# Patient Record
Sex: Male | Born: 1955 | Race: White | Hispanic: No | Marital: Married | Smoking: Former smoker
Health system: Southern US, Community
[De-identification: ages and names within clinical notes are randomized; demographics above are authoritative.]

## PROBLEM LIST (undated history)

## (undated) DIAGNOSIS — E119 Type 2 diabetes mellitus without complications: Secondary | ICD-10-CM

## (undated) DIAGNOSIS — E079 Disorder of thyroid, unspecified: Secondary | ICD-10-CM

## (undated) DIAGNOSIS — I10 Essential (primary) hypertension: Secondary | ICD-10-CM

## (undated) DIAGNOSIS — E785 Hyperlipidemia, unspecified: Secondary | ICD-10-CM

## (undated) HISTORY — DX: Hyperlipidemia, unspecified: E78.5

## (undated) HISTORY — PX: TONSILLECTOMY AND ADENOIDECTOMY: SUR1326

## (undated) HISTORY — PX: HYDROCELE EXCISION / REPAIR: SUR1145

## (undated) HISTORY — DX: Essential (primary) hypertension: I10

## (undated) HISTORY — DX: Type 2 diabetes mellitus without complications: E11.9

## (undated) HISTORY — PX: OTHER SURGICAL HISTORY: SHX169

## (undated) HISTORY — DX: Disorder of thyroid, unspecified: E07.9

## (undated) HISTORY — PX: PROSTATE SURGERY: SHX751

---

## 2013-05-05 DIAGNOSIS — R945 Abnormal results of liver function studies: Secondary | ICD-10-CM | POA: Diagnosis not present

## 2013-05-05 DIAGNOSIS — K7689 Other specified diseases of liver: Secondary | ICD-10-CM | POA: Diagnosis not present

## 2013-05-05 DIAGNOSIS — K5730 Diverticulosis of large intestine without perforation or abscess without bleeding: Secondary | ICD-10-CM | POA: Diagnosis not present

## 2013-05-05 DIAGNOSIS — D3500 Benign neoplasm of unspecified adrenal gland: Secondary | ICD-10-CM | POA: Diagnosis not present

## 2013-05-20 DIAGNOSIS — F331 Major depressive disorder, recurrent, moderate: Secondary | ICD-10-CM | POA: Diagnosis not present

## 2013-05-20 DIAGNOSIS — K5730 Diverticulosis of large intestine without perforation or abscess without bleeding: Secondary | ICD-10-CM | POA: Diagnosis not present

## 2013-05-20 DIAGNOSIS — M1990 Unspecified osteoarthritis, unspecified site: Secondary | ICD-10-CM | POA: Diagnosis not present

## 2013-05-20 DIAGNOSIS — K7689 Other specified diseases of liver: Secondary | ICD-10-CM | POA: Diagnosis not present

## 2013-05-20 DIAGNOSIS — I10 Essential (primary) hypertension: Secondary | ICD-10-CM | POA: Diagnosis not present

## 2013-06-12 DIAGNOSIS — K5730 Diverticulosis of large intestine without perforation or abscess without bleeding: Secondary | ICD-10-CM | POA: Diagnosis not present

## 2013-06-12 DIAGNOSIS — K219 Gastro-esophageal reflux disease without esophagitis: Secondary | ICD-10-CM | POA: Diagnosis not present

## 2013-06-12 DIAGNOSIS — K5900 Constipation, unspecified: Secondary | ICD-10-CM | POA: Diagnosis not present

## 2013-06-12 DIAGNOSIS — R1032 Left lower quadrant pain: Secondary | ICD-10-CM | POA: Diagnosis not present

## 2013-06-24 DIAGNOSIS — R109 Unspecified abdominal pain: Secondary | ICD-10-CM | POA: Diagnosis not present

## 2013-06-24 DIAGNOSIS — K7689 Other specified diseases of liver: Secondary | ICD-10-CM | POA: Diagnosis not present

## 2013-06-24 DIAGNOSIS — K5730 Diverticulosis of large intestine without perforation or abscess without bleeding: Secondary | ICD-10-CM | POA: Diagnosis not present

## 2013-06-24 DIAGNOSIS — K209 Esophagitis, unspecified without bleeding: Secondary | ICD-10-CM | POA: Diagnosis not present

## 2013-06-24 DIAGNOSIS — I10 Essential (primary) hypertension: Secondary | ICD-10-CM | POA: Diagnosis not present

## 2013-06-24 DIAGNOSIS — F17200 Nicotine dependence, unspecified, uncomplicated: Secondary | ICD-10-CM | POA: Diagnosis not present

## 2013-06-24 DIAGNOSIS — N429 Disorder of prostate, unspecified: Secondary | ICD-10-CM | POA: Diagnosis not present

## 2013-06-24 DIAGNOSIS — Z8673 Personal history of transient ischemic attack (TIA), and cerebral infarction without residual deficits: Secondary | ICD-10-CM | POA: Diagnosis not present

## 2013-06-24 DIAGNOSIS — J438 Other emphysema: Secondary | ICD-10-CM | POA: Diagnosis not present

## 2013-06-24 DIAGNOSIS — K5900 Constipation, unspecified: Secondary | ICD-10-CM | POA: Diagnosis not present

## 2013-06-24 DIAGNOSIS — K2990 Gastroduodenitis, unspecified, without bleeding: Secondary | ICD-10-CM | POA: Diagnosis not present

## 2013-06-24 DIAGNOSIS — K208 Other esophagitis without bleeding: Secondary | ICD-10-CM | POA: Diagnosis not present

## 2013-06-24 DIAGNOSIS — K219 Gastro-esophageal reflux disease without esophagitis: Secondary | ICD-10-CM | POA: Diagnosis not present

## 2013-06-24 DIAGNOSIS — Z86010 Personal history of colonic polyps: Secondary | ICD-10-CM | POA: Diagnosis not present

## 2013-06-24 DIAGNOSIS — Z79899 Other long term (current) drug therapy: Secondary | ICD-10-CM | POA: Diagnosis not present

## 2013-06-24 DIAGNOSIS — E7800 Pure hypercholesterolemia, unspecified: Secondary | ICD-10-CM | POA: Diagnosis not present

## 2013-06-24 DIAGNOSIS — K644 Residual hemorrhoidal skin tags: Secondary | ICD-10-CM | POA: Diagnosis not present

## 2013-06-24 DIAGNOSIS — K2970 Gastritis, unspecified, without bleeding: Secondary | ICD-10-CM | POA: Diagnosis not present

## 2013-06-24 DIAGNOSIS — M129 Arthropathy, unspecified: Secondary | ICD-10-CM | POA: Diagnosis not present

## 2013-06-24 DIAGNOSIS — Z1211 Encounter for screening for malignant neoplasm of colon: Secondary | ICD-10-CM | POA: Diagnosis not present

## 2013-06-24 DIAGNOSIS — D126 Benign neoplasm of colon, unspecified: Secondary | ICD-10-CM | POA: Diagnosis not present

## 2013-06-24 DIAGNOSIS — I519 Heart disease, unspecified: Secondary | ICD-10-CM | POA: Diagnosis not present

## 2013-06-24 DIAGNOSIS — E079 Disorder of thyroid, unspecified: Secondary | ICD-10-CM | POA: Diagnosis not present

## 2013-07-22 DIAGNOSIS — J449 Chronic obstructive pulmonary disease, unspecified: Secondary | ICD-10-CM | POA: Diagnosis not present

## 2013-07-22 DIAGNOSIS — E782 Mixed hyperlipidemia: Secondary | ICD-10-CM | POA: Diagnosis not present

## 2013-07-22 DIAGNOSIS — I10 Essential (primary) hypertension: Secondary | ICD-10-CM | POA: Diagnosis not present

## 2013-07-22 DIAGNOSIS — I2510 Atherosclerotic heart disease of native coronary artery without angina pectoris: Secondary | ICD-10-CM | POA: Diagnosis not present

## 2013-08-19 DIAGNOSIS — F331 Major depressive disorder, recurrent, moderate: Secondary | ICD-10-CM | POA: Diagnosis not present

## 2013-09-24 DIAGNOSIS — J449 Chronic obstructive pulmonary disease, unspecified: Secondary | ICD-10-CM | POA: Diagnosis not present

## 2013-09-24 DIAGNOSIS — I672 Cerebral atherosclerosis: Secondary | ICD-10-CM | POA: Diagnosis not present

## 2013-09-24 DIAGNOSIS — R5381 Other malaise: Secondary | ICD-10-CM | POA: Diagnosis not present

## 2013-09-24 DIAGNOSIS — I498 Other specified cardiac arrhythmias: Secondary | ICD-10-CM | POA: Diagnosis not present

## 2013-09-24 DIAGNOSIS — I10 Essential (primary) hypertension: Secondary | ICD-10-CM | POA: Diagnosis not present

## 2013-09-24 DIAGNOSIS — R5383 Other fatigue: Secondary | ICD-10-CM | POA: Diagnosis not present

## 2013-09-24 DIAGNOSIS — E039 Hypothyroidism, unspecified: Secondary | ICD-10-CM | POA: Diagnosis not present

## 2013-09-24 DIAGNOSIS — I2510 Atherosclerotic heart disease of native coronary artery without angina pectoris: Secondary | ICD-10-CM | POA: Diagnosis not present

## 2013-09-30 DIAGNOSIS — I10 Essential (primary) hypertension: Secondary | ICD-10-CM | POA: Diagnosis not present

## 2013-09-30 DIAGNOSIS — E039 Hypothyroidism, unspecified: Secondary | ICD-10-CM | POA: Diagnosis not present

## 2013-09-30 DIAGNOSIS — I498 Other specified cardiac arrhythmias: Secondary | ICD-10-CM | POA: Diagnosis not present

## 2013-09-30 DIAGNOSIS — R5383 Other fatigue: Secondary | ICD-10-CM | POA: Diagnosis not present

## 2013-09-30 DIAGNOSIS — J449 Chronic obstructive pulmonary disease, unspecified: Secondary | ICD-10-CM | POA: Diagnosis not present

## 2013-09-30 DIAGNOSIS — R5381 Other malaise: Secondary | ICD-10-CM | POA: Diagnosis not present

## 2013-09-30 DIAGNOSIS — I672 Cerebral atherosclerosis: Secondary | ICD-10-CM | POA: Diagnosis not present

## 2013-09-30 DIAGNOSIS — I2510 Atherosclerotic heart disease of native coronary artery without angina pectoris: Secondary | ICD-10-CM | POA: Diagnosis not present

## 2013-10-15 DIAGNOSIS — F331 Major depressive disorder, recurrent, moderate: Secondary | ICD-10-CM | POA: Diagnosis not present

## 2013-11-20 ENCOUNTER — Ambulatory Visit: Payer: Self-pay | Admitting: Cardiology

## 2013-11-20 DIAGNOSIS — I2510 Atherosclerotic heart disease of native coronary artery without angina pectoris: Secondary | ICD-10-CM | POA: Diagnosis not present

## 2013-11-20 DIAGNOSIS — I498 Other specified cardiac arrhythmias: Secondary | ICD-10-CM | POA: Diagnosis not present

## 2013-11-20 DIAGNOSIS — I209 Angina pectoris, unspecified: Secondary | ICD-10-CM | POA: Diagnosis not present

## 2013-11-20 DIAGNOSIS — I509 Heart failure, unspecified: Secondary | ICD-10-CM | POA: Diagnosis not present

## 2013-11-20 DIAGNOSIS — R0602 Shortness of breath: Secondary | ICD-10-CM | POA: Diagnosis not present

## 2013-12-02 DIAGNOSIS — R079 Chest pain, unspecified: Secondary | ICD-10-CM | POA: Diagnosis not present

## 2013-12-02 DIAGNOSIS — I739 Peripheral vascular disease, unspecified: Secondary | ICD-10-CM | POA: Diagnosis not present

## 2013-12-02 DIAGNOSIS — R55 Syncope and collapse: Secondary | ICD-10-CM | POA: Diagnosis not present

## 2013-12-02 DIAGNOSIS — Z23 Encounter for immunization: Secondary | ICD-10-CM | POA: Diagnosis not present

## 2013-12-02 DIAGNOSIS — R9431 Abnormal electrocardiogram [ECG] [EKG]: Secondary | ICD-10-CM | POA: Diagnosis not present

## 2013-12-02 DIAGNOSIS — I440 Atrioventricular block, first degree: Secondary | ICD-10-CM | POA: Diagnosis not present

## 2013-12-02 DIAGNOSIS — R001 Bradycardia, unspecified: Secondary | ICD-10-CM | POA: Diagnosis not present

## 2013-12-02 DIAGNOSIS — Z79899 Other long term (current) drug therapy: Secondary | ICD-10-CM | POA: Diagnosis not present

## 2013-12-02 DIAGNOSIS — R0600 Dyspnea, unspecified: Secondary | ICD-10-CM | POA: Diagnosis not present

## 2013-12-02 DIAGNOSIS — R0602 Shortness of breath: Secondary | ICD-10-CM | POA: Diagnosis not present

## 2013-12-02 DIAGNOSIS — I25119 Atherosclerotic heart disease of native coronary artery with unspecified angina pectoris: Secondary | ICD-10-CM | POA: Diagnosis not present

## 2013-12-03 DIAGNOSIS — Z79899 Other long term (current) drug therapy: Secondary | ICD-10-CM | POA: Diagnosis not present

## 2013-12-03 DIAGNOSIS — R0600 Dyspnea, unspecified: Secondary | ICD-10-CM | POA: Diagnosis not present

## 2013-12-03 DIAGNOSIS — R0602 Shortness of breath: Secondary | ICD-10-CM | POA: Diagnosis not present

## 2013-12-03 DIAGNOSIS — I25119 Atherosclerotic heart disease of native coronary artery with unspecified angina pectoris: Secondary | ICD-10-CM | POA: Diagnosis not present

## 2013-12-03 DIAGNOSIS — R55 Syncope and collapse: Secondary | ICD-10-CM | POA: Diagnosis not present

## 2013-12-03 DIAGNOSIS — I739 Peripheral vascular disease, unspecified: Secondary | ICD-10-CM | POA: Diagnosis not present

## 2013-12-24 DIAGNOSIS — F331 Major depressive disorder, recurrent, moderate: Secondary | ICD-10-CM | POA: Diagnosis not present

## 2014-03-23 DIAGNOSIS — F3341 Major depressive disorder, recurrent, in partial remission: Secondary | ICD-10-CM | POA: Diagnosis not present

## 2014-04-20 DIAGNOSIS — J449 Chronic obstructive pulmonary disease, unspecified: Secondary | ICD-10-CM | POA: Diagnosis not present

## 2014-04-20 DIAGNOSIS — M25461 Effusion, right knee: Secondary | ICD-10-CM | POA: Diagnosis not present

## 2014-04-20 DIAGNOSIS — M7989 Other specified soft tissue disorders: Secondary | ICD-10-CM | POA: Diagnosis not present

## 2014-04-20 DIAGNOSIS — S8991XA Unspecified injury of right lower leg, initial encounter: Secondary | ICD-10-CM | POA: Diagnosis not present

## 2014-04-20 DIAGNOSIS — R079 Chest pain, unspecified: Secondary | ICD-10-CM | POA: Diagnosis not present

## 2014-04-20 DIAGNOSIS — E785 Hyperlipidemia, unspecified: Secondary | ICD-10-CM | POA: Diagnosis not present

## 2014-04-20 DIAGNOSIS — M25561 Pain in right knee: Secondary | ICD-10-CM | POA: Diagnosis not present

## 2014-04-20 DIAGNOSIS — S299XXA Unspecified injury of thorax, initial encounter: Secondary | ICD-10-CM | POA: Diagnosis not present

## 2014-04-20 DIAGNOSIS — M549 Dorsalgia, unspecified: Secondary | ICD-10-CM | POA: Diagnosis not present

## 2014-04-20 DIAGNOSIS — I10 Essential (primary) hypertension: Secondary | ICD-10-CM | POA: Diagnosis not present

## 2014-04-20 DIAGNOSIS — E039 Hypothyroidism, unspecified: Secondary | ICD-10-CM | POA: Diagnosis not present

## 2014-04-21 DIAGNOSIS — E785 Hyperlipidemia, unspecified: Secondary | ICD-10-CM | POA: Diagnosis not present

## 2014-04-21 DIAGNOSIS — I10 Essential (primary) hypertension: Secondary | ICD-10-CM | POA: Diagnosis not present

## 2014-05-05 DIAGNOSIS — I10 Essential (primary) hypertension: Secondary | ICD-10-CM | POA: Diagnosis not present

## 2014-05-18 DIAGNOSIS — I10 Essential (primary) hypertension: Secondary | ICD-10-CM | POA: Diagnosis not present

## 2014-06-15 DIAGNOSIS — F3341 Major depressive disorder, recurrent, in partial remission: Secondary | ICD-10-CM | POA: Diagnosis not present

## 2014-10-05 DIAGNOSIS — R0602 Shortness of breath: Secondary | ICD-10-CM | POA: Diagnosis not present

## 2014-10-05 DIAGNOSIS — J449 Chronic obstructive pulmonary disease, unspecified: Secondary | ICD-10-CM | POA: Diagnosis not present

## 2014-10-05 DIAGNOSIS — R042 Hemoptysis: Secondary | ICD-10-CM | POA: Diagnosis not present

## 2014-10-12 DIAGNOSIS — R042 Hemoptysis: Secondary | ICD-10-CM | POA: Diagnosis not present

## 2014-10-12 DIAGNOSIS — I10 Essential (primary) hypertension: Secondary | ICD-10-CM | POA: Diagnosis not present

## 2014-10-15 DIAGNOSIS — J8410 Pulmonary fibrosis, unspecified: Secondary | ICD-10-CM | POA: Diagnosis not present

## 2014-10-15 DIAGNOSIS — I2510 Atherosclerotic heart disease of native coronary artery without angina pectoris: Secondary | ICD-10-CM | POA: Diagnosis not present

## 2014-10-15 DIAGNOSIS — R042 Hemoptysis: Secondary | ICD-10-CM | POA: Diagnosis not present

## 2014-10-15 DIAGNOSIS — J440 Chronic obstructive pulmonary disease with acute lower respiratory infection: Secondary | ICD-10-CM | POA: Diagnosis not present

## 2014-10-20 DIAGNOSIS — R042 Hemoptysis: Secondary | ICD-10-CM | POA: Diagnosis not present

## 2014-10-20 DIAGNOSIS — J440 Chronic obstructive pulmonary disease with acute lower respiratory infection: Secondary | ICD-10-CM | POA: Diagnosis not present

## 2014-11-11 DIAGNOSIS — F321 Major depressive disorder, single episode, moderate: Secondary | ICD-10-CM | POA: Diagnosis not present

## 2014-11-11 DIAGNOSIS — M549 Dorsalgia, unspecified: Secondary | ICD-10-CM | POA: Diagnosis not present

## 2014-11-11 DIAGNOSIS — R0609 Other forms of dyspnea: Secondary | ICD-10-CM | POA: Diagnosis not present

## 2014-11-11 DIAGNOSIS — E785 Hyperlipidemia, unspecified: Secondary | ICD-10-CM | POA: Diagnosis not present

## 2014-11-11 DIAGNOSIS — Z23 Encounter for immunization: Secondary | ICD-10-CM | POA: Diagnosis not present

## 2014-12-11 DIAGNOSIS — M25531 Pain in right wrist: Secondary | ICD-10-CM | POA: Diagnosis not present

## 2014-12-11 DIAGNOSIS — R0602 Shortness of breath: Secondary | ICD-10-CM | POA: Diagnosis not present

## 2014-12-11 DIAGNOSIS — R001 Bradycardia, unspecified: Secondary | ICD-10-CM | POA: Diagnosis not present

## 2014-12-11 DIAGNOSIS — R002 Palpitations: Secondary | ICD-10-CM | POA: Diagnosis not present

## 2014-12-11 DIAGNOSIS — R079 Chest pain, unspecified: Secondary | ICD-10-CM | POA: Diagnosis not present

## 2014-12-11 DIAGNOSIS — M19031 Primary osteoarthritis, right wrist: Secondary | ICD-10-CM | POA: Diagnosis not present

## 2014-12-11 DIAGNOSIS — R55 Syncope and collapse: Secondary | ICD-10-CM | POA: Diagnosis not present

## 2014-12-11 DIAGNOSIS — I509 Heart failure, unspecified: Secondary | ICD-10-CM | POA: Diagnosis not present

## 2014-12-11 DIAGNOSIS — M25539 Pain in unspecified wrist: Secondary | ICD-10-CM | POA: Diagnosis not present

## 2014-12-11 DIAGNOSIS — I209 Angina pectoris, unspecified: Secondary | ICD-10-CM | POA: Diagnosis not present

## 2014-12-15 DIAGNOSIS — R002 Palpitations: Secondary | ICD-10-CM | POA: Diagnosis not present

## 2015-01-14 DIAGNOSIS — R0789 Other chest pain: Secondary | ICD-10-CM | POA: Diagnosis not present

## 2015-01-14 DIAGNOSIS — R0602 Shortness of breath: Secondary | ICD-10-CM | POA: Diagnosis not present

## 2015-01-14 DIAGNOSIS — R001 Bradycardia, unspecified: Secondary | ICD-10-CM | POA: Diagnosis not present

## 2015-01-15 DIAGNOSIS — R001 Bradycardia, unspecified: Secondary | ICD-10-CM | POA: Diagnosis not present

## 2015-01-15 DIAGNOSIS — R0789 Other chest pain: Secondary | ICD-10-CM | POA: Diagnosis not present

## 2015-01-15 DIAGNOSIS — R0602 Shortness of breath: Secondary | ICD-10-CM | POA: Diagnosis not present

## 2015-01-27 DIAGNOSIS — R55 Syncope and collapse: Secondary | ICD-10-CM | POA: Diagnosis not present

## 2015-01-27 DIAGNOSIS — I209 Angina pectoris, unspecified: Secondary | ICD-10-CM | POA: Diagnosis not present

## 2015-01-27 DIAGNOSIS — R001 Bradycardia, unspecified: Secondary | ICD-10-CM | POA: Diagnosis not present

## 2015-05-30 DIAGNOSIS — L02214 Cutaneous abscess of groin: Secondary | ICD-10-CM | POA: Diagnosis not present

## 2015-06-04 DIAGNOSIS — I10 Essential (primary) hypertension: Secondary | ICD-10-CM | POA: Diagnosis not present

## 2015-06-04 DIAGNOSIS — T814XXD Infection following a procedure, subsequent encounter: Secondary | ICD-10-CM | POA: Diagnosis not present

## 2015-06-10 DIAGNOSIS — L0291 Cutaneous abscess, unspecified: Secondary | ICD-10-CM | POA: Diagnosis not present

## 2015-06-10 DIAGNOSIS — I10 Essential (primary) hypertension: Secondary | ICD-10-CM | POA: Diagnosis not present

## 2015-06-10 DIAGNOSIS — K219 Gastro-esophageal reflux disease without esophagitis: Secondary | ICD-10-CM | POA: Diagnosis not present

## 2015-06-10 DIAGNOSIS — L0889 Other specified local infections of the skin and subcutaneous tissue: Secondary | ICD-10-CM | POA: Diagnosis not present

## 2015-06-22 DIAGNOSIS — L0291 Cutaneous abscess, unspecified: Secondary | ICD-10-CM | POA: Diagnosis not present

## 2015-06-22 DIAGNOSIS — L0889 Other specified local infections of the skin and subcutaneous tissue: Secondary | ICD-10-CM | POA: Diagnosis not present

## 2015-08-06 DIAGNOSIS — G629 Polyneuropathy, unspecified: Secondary | ICD-10-CM | POA: Diagnosis not present

## 2015-08-06 DIAGNOSIS — I10 Essential (primary) hypertension: Secondary | ICD-10-CM | POA: Diagnosis not present

## 2015-08-06 DIAGNOSIS — M549 Dorsalgia, unspecified: Secondary | ICD-10-CM | POA: Diagnosis not present

## 2015-08-06 DIAGNOSIS — E785 Hyperlipidemia, unspecified: Secondary | ICD-10-CM | POA: Diagnosis not present

## 2015-08-18 DIAGNOSIS — R079 Chest pain, unspecified: Secondary | ICD-10-CM | POA: Diagnosis not present

## 2015-08-18 DIAGNOSIS — R55 Syncope and collapse: Secondary | ICD-10-CM | POA: Diagnosis not present

## 2015-09-08 DIAGNOSIS — G629 Polyneuropathy, unspecified: Secondary | ICD-10-CM | POA: Diagnosis not present

## 2015-09-08 DIAGNOSIS — I10 Essential (primary) hypertension: Secondary | ICD-10-CM | POA: Diagnosis not present

## 2015-09-08 DIAGNOSIS — E1165 Type 2 diabetes mellitus with hyperglycemia: Secondary | ICD-10-CM | POA: Diagnosis not present

## 2015-09-08 DIAGNOSIS — E785 Hyperlipidemia, unspecified: Secondary | ICD-10-CM | POA: Diagnosis not present

## 2015-10-01 DIAGNOSIS — R079 Chest pain, unspecified: Secondary | ICD-10-CM | POA: Diagnosis not present

## 2015-11-09 DIAGNOSIS — I2510 Atherosclerotic heart disease of native coronary artery without angina pectoris: Secondary | ICD-10-CM | POA: Diagnosis not present

## 2015-11-09 DIAGNOSIS — R072 Precordial pain: Secondary | ICD-10-CM | POA: Diagnosis not present

## 2015-11-24 DIAGNOSIS — E0851 Diabetes mellitus due to underlying condition with diabetic peripheral angiopathy without gangrene: Secondary | ICD-10-CM | POA: Diagnosis not present

## 2015-11-24 DIAGNOSIS — I10 Essential (primary) hypertension: Secondary | ICD-10-CM | POA: Diagnosis not present

## 2015-11-24 DIAGNOSIS — E039 Hypothyroidism, unspecified: Secondary | ICD-10-CM | POA: Diagnosis not present

## 2015-11-24 DIAGNOSIS — K279 Peptic ulcer, site unspecified, unspecified as acute or chronic, without hemorrhage or perforation: Secondary | ICD-10-CM | POA: Diagnosis not present

## 2015-11-24 DIAGNOSIS — E119 Type 2 diabetes mellitus without complications: Secondary | ICD-10-CM | POA: Diagnosis not present

## 2015-11-24 DIAGNOSIS — E785 Hyperlipidemia, unspecified: Secondary | ICD-10-CM | POA: Diagnosis not present

## 2015-12-07 DIAGNOSIS — R1013 Epigastric pain: Secondary | ICD-10-CM | POA: Diagnosis not present

## 2015-12-07 DIAGNOSIS — K279 Peptic ulcer, site unspecified, unspecified as acute or chronic, without hemorrhage or perforation: Secondary | ICD-10-CM | POA: Diagnosis not present

## 2015-12-22 DIAGNOSIS — K279 Peptic ulcer, site unspecified, unspecified as acute or chronic, without hemorrhage or perforation: Secondary | ICD-10-CM | POA: Diagnosis not present

## 2015-12-22 DIAGNOSIS — E785 Hyperlipidemia, unspecified: Secondary | ICD-10-CM | POA: Diagnosis not present

## 2015-12-22 DIAGNOSIS — I10 Essential (primary) hypertension: Secondary | ICD-10-CM | POA: Diagnosis not present

## 2015-12-22 DIAGNOSIS — E0851 Diabetes mellitus due to underlying condition with diabetic peripheral angiopathy without gangrene: Secondary | ICD-10-CM | POA: Diagnosis not present

## 2016-03-12 DIAGNOSIS — J449 Chronic obstructive pulmonary disease, unspecified: Secondary | ICD-10-CM | POA: Diagnosis not present

## 2016-03-12 DIAGNOSIS — R0602 Shortness of breath: Secondary | ICD-10-CM | POA: Diagnosis not present

## 2016-04-21 DIAGNOSIS — E1140 Type 2 diabetes mellitus with diabetic neuropathy, unspecified: Secondary | ICD-10-CM | POA: Diagnosis not present

## 2016-04-21 DIAGNOSIS — E119 Type 2 diabetes mellitus without complications: Secondary | ICD-10-CM | POA: Diagnosis not present

## 2016-04-21 DIAGNOSIS — E785 Hyperlipidemia, unspecified: Secondary | ICD-10-CM | POA: Diagnosis not present

## 2016-04-21 DIAGNOSIS — N281 Cyst of kidney, acquired: Secondary | ICD-10-CM | POA: Diagnosis not present

## 2016-04-21 DIAGNOSIS — I10 Essential (primary) hypertension: Secondary | ICD-10-CM | POA: Diagnosis not present

## 2016-04-27 DIAGNOSIS — K760 Fatty (change of) liver, not elsewhere classified: Secondary | ICD-10-CM | POA: Diagnosis not present

## 2016-04-27 DIAGNOSIS — N2889 Other specified disorders of kidney and ureter: Secondary | ICD-10-CM | POA: Diagnosis not present

## 2016-04-27 DIAGNOSIS — D3500 Benign neoplasm of unspecified adrenal gland: Secondary | ICD-10-CM | POA: Diagnosis not present

## 2016-05-19 DIAGNOSIS — E785 Hyperlipidemia, unspecified: Secondary | ICD-10-CM | POA: Diagnosis not present

## 2016-05-19 DIAGNOSIS — E1140 Type 2 diabetes mellitus with diabetic neuropathy, unspecified: Secondary | ICD-10-CM | POA: Diagnosis not present

## 2016-05-19 DIAGNOSIS — I10 Essential (primary) hypertension: Secondary | ICD-10-CM | POA: Diagnosis not present

## 2016-05-30 DIAGNOSIS — R0602 Shortness of breath: Secondary | ICD-10-CM | POA: Diagnosis not present

## 2016-05-30 DIAGNOSIS — R05 Cough: Secondary | ICD-10-CM | POA: Diagnosis not present

## 2016-05-30 DIAGNOSIS — R0600 Dyspnea, unspecified: Secondary | ICD-10-CM | POA: Diagnosis not present

## 2016-07-09 DIAGNOSIS — J449 Chronic obstructive pulmonary disease, unspecified: Secondary | ICD-10-CM | POA: Diagnosis not present

## 2016-09-12 DIAGNOSIS — E119 Type 2 diabetes mellitus without complications: Secondary | ICD-10-CM | POA: Diagnosis not present

## 2016-09-12 DIAGNOSIS — N200 Calculus of kidney: Secondary | ICD-10-CM | POA: Diagnosis not present

## 2016-09-12 DIAGNOSIS — I10 Essential (primary) hypertension: Secondary | ICD-10-CM | POA: Diagnosis not present

## 2016-11-08 ENCOUNTER — Other Ambulatory Visit: Payer: Self-pay

## 2016-11-08 NOTE — Patient Outreach (Signed)
[place] [facility]) Care Management  11/08/2016  Bryan Mccormick February 04, 1956 [id number]   Medication Adherence call to Bryan Mccormick the reason for this call is because [name] is showing past due on two of his medication Atorvastatin 80 mg and Metformin 500 mg spoke to [facility] they said patient already pick up both of his medication on September 11 patient wont be due until three more month.    [place] Management Direct Dial [phone]  Fax [phone] [email or web address]

## 2017-01-08 DIAGNOSIS — I739 Peripheral vascular disease, unspecified: Secondary | ICD-10-CM | POA: Diagnosis not present

## 2017-01-08 DIAGNOSIS — I10 Essential (primary) hypertension: Secondary | ICD-10-CM | POA: Diagnosis not present

## 2017-01-08 DIAGNOSIS — E1140 Type 2 diabetes mellitus with diabetic neuropathy, unspecified: Secondary | ICD-10-CM | POA: Diagnosis not present

## 2017-01-08 DIAGNOSIS — E785 Hyperlipidemia, unspecified: Secondary | ICD-10-CM | POA: Diagnosis not present

## 2017-01-08 DIAGNOSIS — Z23 Encounter for immunization: Secondary | ICD-10-CM | POA: Diagnosis not present

## 2017-02-07 DIAGNOSIS — Z1389 Encounter for screening for other disorder: Secondary | ICD-10-CM | POA: Diagnosis not present

## 2017-02-07 DIAGNOSIS — Z0000 Encounter for general adult medical examination without abnormal findings: Secondary | ICD-10-CM | POA: Diagnosis not present

## 2017-02-07 DIAGNOSIS — N400 Enlarged prostate without lower urinary tract symptoms: Secondary | ICD-10-CM | POA: Diagnosis not present

## 2017-02-07 DIAGNOSIS — E039 Hypothyroidism, unspecified: Secondary | ICD-10-CM | POA: Diagnosis not present

## 2017-02-07 DIAGNOSIS — E785 Hyperlipidemia, unspecified: Secondary | ICD-10-CM | POA: Diagnosis not present

## 2017-02-07 DIAGNOSIS — E119 Type 2 diabetes mellitus without complications: Secondary | ICD-10-CM | POA: Diagnosis not present

## 2017-02-16 DIAGNOSIS — N3000 Acute cystitis without hematuria: Secondary | ICD-10-CM | POA: Diagnosis not present

## 2017-02-22 DIAGNOSIS — R972 Elevated prostate specific antigen [PSA]: Secondary | ICD-10-CM | POA: Diagnosis not present

## 2017-05-14 DIAGNOSIS — E785 Hyperlipidemia, unspecified: Secondary | ICD-10-CM | POA: Diagnosis not present

## 2017-05-14 DIAGNOSIS — I739 Peripheral vascular disease, unspecified: Secondary | ICD-10-CM | POA: Diagnosis not present

## 2017-05-14 DIAGNOSIS — I10 Essential (primary) hypertension: Secondary | ICD-10-CM | POA: Diagnosis not present

## 2017-05-14 DIAGNOSIS — E1140 Type 2 diabetes mellitus with diabetic neuropathy, unspecified: Secondary | ICD-10-CM | POA: Diagnosis not present

## 2017-05-16 DIAGNOSIS — R002 Palpitations: Secondary | ICD-10-CM | POA: Diagnosis not present

## 2017-07-16 DIAGNOSIS — N3020 Other chronic cystitis without hematuria: Secondary | ICD-10-CM | POA: Diagnosis not present

## 2017-07-16 DIAGNOSIS — E291 Testicular hypofunction: Secondary | ICD-10-CM | POA: Diagnosis not present

## 2017-07-16 DIAGNOSIS — R972 Elevated prostate specific antigen [PSA]: Secondary | ICD-10-CM | POA: Diagnosis not present

## 2017-07-24 DIAGNOSIS — R972 Elevated prostate specific antigen [PSA]: Secondary | ICD-10-CM | POA: Diagnosis not present

## 2017-07-24 DIAGNOSIS — N3020 Other chronic cystitis without hematuria: Secondary | ICD-10-CM | POA: Diagnosis not present

## 2017-08-02 DIAGNOSIS — R972 Elevated prostate specific antigen [PSA]: Secondary | ICD-10-CM | POA: Diagnosis not present

## 2017-08-02 DIAGNOSIS — N4232 Atypical small acinar proliferation of prostate: Secondary | ICD-10-CM | POA: Diagnosis not present

## 2017-08-13 DIAGNOSIS — R972 Elevated prostate specific antigen [PSA]: Secondary | ICD-10-CM | POA: Diagnosis not present

## 2017-08-20 DIAGNOSIS — N3000 Acute cystitis without hematuria: Secondary | ICD-10-CM | POA: Diagnosis not present

## 2017-08-20 DIAGNOSIS — J42 Unspecified chronic bronchitis: Secondary | ICD-10-CM | POA: Diagnosis not present

## 2017-08-23 DIAGNOSIS — J449 Chronic obstructive pulmonary disease, unspecified: Secondary | ICD-10-CM | POA: Diagnosis not present

## 2017-08-27 DIAGNOSIS — N3090 Cystitis, unspecified without hematuria: Secondary | ICD-10-CM | POA: Diagnosis not present

## 2017-08-27 DIAGNOSIS — N3020 Other chronic cystitis without hematuria: Secondary | ICD-10-CM | POA: Diagnosis not present

## 2017-08-31 DIAGNOSIS — N3020 Other chronic cystitis without hematuria: Secondary | ICD-10-CM | POA: Diagnosis not present

## 2017-09-25 DIAGNOSIS — N3020 Other chronic cystitis without hematuria: Secondary | ICD-10-CM | POA: Diagnosis not present

## 2017-09-28 ENCOUNTER — Other Ambulatory Visit: Payer: Self-pay

## 2017-09-28 NOTE — Patient Outreach (Signed)
[place] [facility]) Care Management  09/28/2017  Bryan Mccormick [birth year] [id number]   Medication Adherence call to Bryan Mccormick patients telephone number is disconnected on [facility] and Epic patient is due on Atorvastatin 80 mg. Bryan Mccormick is showing past due under [place].  [place] Management Direct Dial [phone]  Fax [phone] [email or web address]

## 2017-11-22 DIAGNOSIS — I739 Peripheral vascular disease, unspecified: Secondary | ICD-10-CM | POA: Diagnosis not present

## 2017-11-22 DIAGNOSIS — E782 Mixed hyperlipidemia: Secondary | ICD-10-CM | POA: Diagnosis not present

## 2017-11-22 DIAGNOSIS — Z23 Encounter for immunization: Secondary | ICD-10-CM | POA: Diagnosis not present

## 2017-11-22 DIAGNOSIS — I10 Essential (primary) hypertension: Secondary | ICD-10-CM | POA: Diagnosis not present

## 2017-11-22 DIAGNOSIS — E1140 Type 2 diabetes mellitus with diabetic neuropathy, unspecified: Secondary | ICD-10-CM | POA: Diagnosis not present

## 2017-12-25 DIAGNOSIS — E291 Testicular hypofunction: Secondary | ICD-10-CM | POA: Diagnosis not present

## 2017-12-25 DIAGNOSIS — R972 Elevated prostate specific antigen [PSA]: Secondary | ICD-10-CM | POA: Diagnosis not present

## 2018-03-12 DIAGNOSIS — E1169 Type 2 diabetes mellitus with other specified complication: Secondary | ICD-10-CM | POA: Diagnosis not present

## 2018-03-12 DIAGNOSIS — Z0000 Encounter for general adult medical examination without abnormal findings: Secondary | ICD-10-CM | POA: Diagnosis not present

## 2018-03-12 DIAGNOSIS — I10 Essential (primary) hypertension: Secondary | ICD-10-CM | POA: Diagnosis not present

## 2018-03-12 DIAGNOSIS — G8194 Hemiplegia, unspecified affecting left nondominant side: Secondary | ICD-10-CM | POA: Diagnosis not present

## 2018-03-12 DIAGNOSIS — E782 Mixed hyperlipidemia: Secondary | ICD-10-CM | POA: Diagnosis not present

## 2018-03-12 DIAGNOSIS — Z1389 Encounter for screening for other disorder: Secondary | ICD-10-CM | POA: Diagnosis not present

## 2018-05-14 DIAGNOSIS — N3090 Cystitis, unspecified without hematuria: Secondary | ICD-10-CM | POA: Diagnosis not present

## 2018-05-14 DIAGNOSIS — N3020 Other chronic cystitis without hematuria: Secondary | ICD-10-CM | POA: Diagnosis not present

## 2018-05-21 DIAGNOSIS — R10813 Right lower quadrant abdominal tenderness: Secondary | ICD-10-CM | POA: Diagnosis not present

## 2018-06-12 DIAGNOSIS — E1169 Type 2 diabetes mellitus with other specified complication: Secondary | ICD-10-CM | POA: Diagnosis not present

## 2018-06-12 DIAGNOSIS — E782 Mixed hyperlipidemia: Secondary | ICD-10-CM | POA: Diagnosis not present

## 2018-06-12 DIAGNOSIS — I10 Essential (primary) hypertension: Secondary | ICD-10-CM | POA: Diagnosis not present

## 2018-06-12 DIAGNOSIS — G8194 Hemiplegia, unspecified affecting left nondominant side: Secondary | ICD-10-CM | POA: Diagnosis not present

## 2018-07-15 ENCOUNTER — Other Ambulatory Visit: Payer: Self-pay

## 2018-07-15 NOTE — Patient Outreach (Signed)
[place] [facility]) Care Management  07/15/2018  Bryan Mccormick 1956/02/12 [id number]   Medication Adherence call to [place] Dooms patient has a disconnected number. [name] is showing past due under [place].   [place] Management Direct Dial [phone]  Fax [phone] [name]

## 2018-07-29 ENCOUNTER — Other Ambulatory Visit: Payer: Self-pay

## 2018-07-29 NOTE — Patient Outreach (Signed)
[place] [facility]) Care Management  07/29/2018  Bryan Mccormick 10-16-1955 [id number]   Medication Adherence call to [name] patient has a disconnected Number [name] is showing past due under [place].   [place] Management Direct Dial [phone]  Fax [phone] [email or web address]

## 2018-08-09 DIAGNOSIS — W57XXXA Bitten or stung by nonvenomous insect and other nonvenomous arthropods, initial encounter: Secondary | ICD-10-CM | POA: Diagnosis not present

## 2018-08-09 DIAGNOSIS — Z7689 Persons encountering health services in other specified circumstances: Secondary | ICD-10-CM | POA: Diagnosis not present

## 2018-08-09 DIAGNOSIS — L02419 Cutaneous abscess of limb, unspecified: Secondary | ICD-10-CM | POA: Diagnosis not present

## 2018-08-20 DIAGNOSIS — L905 Scar conditions and fibrosis of skin: Secondary | ICD-10-CM | POA: Diagnosis not present

## 2018-09-11 DIAGNOSIS — G8194 Hemiplegia, unspecified affecting left nondominant side: Secondary | ICD-10-CM | POA: Diagnosis not present

## 2018-09-11 DIAGNOSIS — E782 Mixed hyperlipidemia: Secondary | ICD-10-CM | POA: Diagnosis not present

## 2018-09-11 DIAGNOSIS — E1169 Type 2 diabetes mellitus with other specified complication: Secondary | ICD-10-CM | POA: Diagnosis not present

## 2018-09-11 DIAGNOSIS — I10 Essential (primary) hypertension: Secondary | ICD-10-CM | POA: Diagnosis not present

## 2018-09-11 DIAGNOSIS — E039 Hypothyroidism, unspecified: Secondary | ICD-10-CM | POA: Diagnosis not present

## 2018-12-13 DIAGNOSIS — I739 Peripheral vascular disease, unspecified: Secondary | ICD-10-CM | POA: Diagnosis not present

## 2018-12-13 DIAGNOSIS — E1151 Type 2 diabetes mellitus with diabetic peripheral angiopathy without gangrene: Secondary | ICD-10-CM | POA: Diagnosis not present

## 2018-12-13 DIAGNOSIS — Z23 Encounter for immunization: Secondary | ICD-10-CM | POA: Diagnosis not present

## 2018-12-13 DIAGNOSIS — I10 Essential (primary) hypertension: Secondary | ICD-10-CM | POA: Diagnosis not present

## 2019-02-13 DIAGNOSIS — N3020 Other chronic cystitis without hematuria: Secondary | ICD-10-CM | POA: Diagnosis not present

## 2019-03-18 DIAGNOSIS — G8194 Hemiplegia, unspecified affecting left nondominant side: Secondary | ICD-10-CM | POA: Diagnosis not present

## 2019-03-18 DIAGNOSIS — E1169 Type 2 diabetes mellitus with other specified complication: Secondary | ICD-10-CM | POA: Diagnosis not present

## 2019-03-18 DIAGNOSIS — I10 Essential (primary) hypertension: Secondary | ICD-10-CM | POA: Diagnosis not present

## 2019-03-18 DIAGNOSIS — E782 Mixed hyperlipidemia: Secondary | ICD-10-CM | POA: Diagnosis not present

## 2019-06-16 DIAGNOSIS — I10 Essential (primary) hypertension: Secondary | ICD-10-CM | POA: Diagnosis not present

## 2019-06-16 DIAGNOSIS — E039 Hypothyroidism, unspecified: Secondary | ICD-10-CM | POA: Diagnosis not present

## 2019-06-16 DIAGNOSIS — E1169 Type 2 diabetes mellitus with other specified complication: Secondary | ICD-10-CM | POA: Diagnosis not present

## 2019-06-16 DIAGNOSIS — E782 Mixed hyperlipidemia: Secondary | ICD-10-CM | POA: Diagnosis not present

## 2019-09-15 DIAGNOSIS — E782 Mixed hyperlipidemia: Secondary | ICD-10-CM | POA: Diagnosis not present

## 2019-09-15 DIAGNOSIS — G8194 Hemiplegia, unspecified affecting left nondominant side: Secondary | ICD-10-CM | POA: Diagnosis not present

## 2019-09-15 DIAGNOSIS — I10 Essential (primary) hypertension: Secondary | ICD-10-CM | POA: Diagnosis not present

## 2019-09-15 DIAGNOSIS — R3912 Poor urinary stream: Secondary | ICD-10-CM | POA: Diagnosis not present

## 2019-11-11 DIAGNOSIS — E782 Mixed hyperlipidemia: Secondary | ICD-10-CM | POA: Diagnosis not present

## 2019-11-11 DIAGNOSIS — E1169 Type 2 diabetes mellitus with other specified complication: Secondary | ICD-10-CM | POA: Diagnosis not present

## 2019-12-17 DIAGNOSIS — Z1389 Encounter for screening for other disorder: Secondary | ICD-10-CM | POA: Diagnosis not present

## 2019-12-17 DIAGNOSIS — I10 Essential (primary) hypertension: Secondary | ICD-10-CM | POA: Diagnosis not present

## 2019-12-17 DIAGNOSIS — E782 Mixed hyperlipidemia: Secondary | ICD-10-CM | POA: Diagnosis not present

## 2019-12-17 DIAGNOSIS — Z0000 Encounter for general adult medical examination without abnormal findings: Secondary | ICD-10-CM | POA: Diagnosis not present

## 2019-12-17 DIAGNOSIS — I2510 Atherosclerotic heart disease of native coronary artery without angina pectoris: Secondary | ICD-10-CM | POA: Diagnosis not present

## 2019-12-17 DIAGNOSIS — Z23 Encounter for immunization: Secondary | ICD-10-CM | POA: Diagnosis not present

## 2020-01-08 DIAGNOSIS — Z01818 Encounter for other preprocedural examination: Secondary | ICD-10-CM | POA: Diagnosis not present

## 2020-01-27 DIAGNOSIS — Z1159 Encounter for screening for other viral diseases: Secondary | ICD-10-CM | POA: Diagnosis not present

## 2020-02-03 DIAGNOSIS — J45909 Unspecified asthma, uncomplicated: Secondary | ICD-10-CM | POA: Diagnosis not present

## 2020-02-03 DIAGNOSIS — K635 Polyp of colon: Secondary | ICD-10-CM | POA: Diagnosis not present

## 2020-02-03 DIAGNOSIS — Z955 Presence of coronary angioplasty implant and graft: Secondary | ICD-10-CM | POA: Diagnosis not present

## 2020-02-03 DIAGNOSIS — K5730 Diverticulosis of large intestine without perforation or abscess without bleeding: Secondary | ICD-10-CM | POA: Diagnosis not present

## 2020-02-03 DIAGNOSIS — E119 Type 2 diabetes mellitus without complications: Secondary | ICD-10-CM | POA: Diagnosis not present

## 2020-02-03 DIAGNOSIS — I252 Old myocardial infarction: Secondary | ICD-10-CM | POA: Diagnosis not present

## 2020-02-03 DIAGNOSIS — Z7982 Long term (current) use of aspirin: Secondary | ICD-10-CM | POA: Diagnosis not present

## 2020-02-03 DIAGNOSIS — Z720 Tobacco use: Secondary | ICD-10-CM | POA: Diagnosis not present

## 2020-02-03 DIAGNOSIS — I2510 Atherosclerotic heart disease of native coronary artery without angina pectoris: Secondary | ICD-10-CM | POA: Diagnosis not present

## 2020-02-03 DIAGNOSIS — Z86010 Personal history of colonic polyps: Secondary | ICD-10-CM | POA: Diagnosis not present

## 2020-02-03 DIAGNOSIS — K648 Other hemorrhoids: Secondary | ICD-10-CM | POA: Diagnosis not present

## 2020-02-03 DIAGNOSIS — D122 Benign neoplasm of ascending colon: Secondary | ICD-10-CM | POA: Diagnosis not present

## 2020-02-03 DIAGNOSIS — Z79899 Other long term (current) drug therapy: Secondary | ICD-10-CM | POA: Diagnosis not present

## 2020-02-12 ENCOUNTER — Other Ambulatory Visit: Payer: Self-pay | Admitting: Urology

## 2020-02-12 DIAGNOSIS — R972 Elevated prostate specific antigen [PSA]: Secondary | ICD-10-CM

## 2020-03-09 ENCOUNTER — Other Ambulatory Visit: Payer: Self-pay

## 2020-03-09 ENCOUNTER — Ambulatory Visit
Admission: RE | Admit: 2020-03-09 | Discharge: 2020-03-09 | Disposition: A | Discharge status: Home / Self Care | Payer: Medicare Other | Source: Ambulatory Visit | Attending: Urology | Admitting: Urology

## 2020-03-09 DIAGNOSIS — R972 Elevated prostate specific antigen [PSA]: Secondary | ICD-10-CM

## 2020-03-09 DIAGNOSIS — R590 Localized enlarged lymph nodes: Secondary | ICD-10-CM | POA: Diagnosis not present

## 2020-03-09 MED ORDER — GADOBENATE DIMEGLUMINE 529 MG/ML IV SOLN
18.0000 mL | Freq: Once | INTRAVENOUS | Status: AC | PRN
  Administered 2020-03-09: 18 mL via INTRAVENOUS

## 2020-04-12 DIAGNOSIS — I10 Essential (primary) hypertension: Secondary | ICD-10-CM | POA: Diagnosis not present

## 2020-04-12 DIAGNOSIS — E782 Mixed hyperlipidemia: Secondary | ICD-10-CM | POA: Diagnosis not present

## 2020-04-12 DIAGNOSIS — E1169 Type 2 diabetes mellitus with other specified complication: Secondary | ICD-10-CM | POA: Diagnosis not present

## 2020-04-12 DIAGNOSIS — J449 Chronic obstructive pulmonary disease, unspecified: Secondary | ICD-10-CM | POA: Diagnosis not present

## 2020-07-12 DIAGNOSIS — E1169 Type 2 diabetes mellitus with other specified complication: Secondary | ICD-10-CM | POA: Diagnosis not present

## 2020-07-12 DIAGNOSIS — J449 Chronic obstructive pulmonary disease, unspecified: Secondary | ICD-10-CM | POA: Diagnosis not present

## 2020-07-12 DIAGNOSIS — E782 Mixed hyperlipidemia: Secondary | ICD-10-CM | POA: Diagnosis not present

## 2020-07-12 DIAGNOSIS — I10 Essential (primary) hypertension: Secondary | ICD-10-CM | POA: Diagnosis not present

## 2020-08-13 DIAGNOSIS — M5441 Lumbago with sciatica, right side: Secondary | ICD-10-CM | POA: Diagnosis not present

## 2020-08-13 DIAGNOSIS — G8929 Other chronic pain: Secondary | ICD-10-CM | POA: Diagnosis not present

## 2020-08-13 DIAGNOSIS — R3911 Hesitancy of micturition: Secondary | ICD-10-CM | POA: Diagnosis not present

## 2020-11-15 DIAGNOSIS — I10 Essential (primary) hypertension: Secondary | ICD-10-CM | POA: Diagnosis not present

## 2020-11-15 DIAGNOSIS — J449 Chronic obstructive pulmonary disease, unspecified: Secondary | ICD-10-CM | POA: Diagnosis not present

## 2020-11-15 DIAGNOSIS — Z23 Encounter for immunization: Secondary | ICD-10-CM | POA: Diagnosis not present

## 2020-11-15 DIAGNOSIS — E782 Mixed hyperlipidemia: Secondary | ICD-10-CM | POA: Diagnosis not present

## 2020-11-15 DIAGNOSIS — E1169 Type 2 diabetes mellitus with other specified complication: Secondary | ICD-10-CM | POA: Diagnosis not present

## 2021-02-14 DIAGNOSIS — E782 Mixed hyperlipidemia: Secondary | ICD-10-CM | POA: Diagnosis not present

## 2021-02-14 DIAGNOSIS — J449 Chronic obstructive pulmonary disease, unspecified: Secondary | ICD-10-CM | POA: Diagnosis not present

## 2021-02-14 DIAGNOSIS — I10 Essential (primary) hypertension: Secondary | ICD-10-CM | POA: Diagnosis not present

## 2021-02-14 DIAGNOSIS — E1169 Type 2 diabetes mellitus with other specified complication: Secondary | ICD-10-CM | POA: Diagnosis not present

## 2021-03-01 DIAGNOSIS — I2510 Atherosclerotic heart disease of native coronary artery without angina pectoris: Secondary | ICD-10-CM | POA: Diagnosis not present

## 2021-03-01 DIAGNOSIS — I10 Essential (primary) hypertension: Secondary | ICD-10-CM | POA: Diagnosis not present

## 2021-03-01 DIAGNOSIS — D210 Benign neoplasm of connective and other soft tissue of head, face and neck: Secondary | ICD-10-CM | POA: Diagnosis not present

## 2021-03-01 DIAGNOSIS — J449 Chronic obstructive pulmonary disease, unspecified: Secondary | ICD-10-CM | POA: Diagnosis not present

## 2021-05-16 DIAGNOSIS — J449 Chronic obstructive pulmonary disease, unspecified: Secondary | ICD-10-CM | POA: Diagnosis not present

## 2021-05-16 DIAGNOSIS — E782 Mixed hyperlipidemia: Secondary | ICD-10-CM | POA: Diagnosis not present

## 2021-05-16 DIAGNOSIS — E1169 Type 2 diabetes mellitus with other specified complication: Secondary | ICD-10-CM | POA: Diagnosis not present

## 2021-05-16 DIAGNOSIS — I10 Essential (primary) hypertension: Secondary | ICD-10-CM | POA: Diagnosis not present

## 2021-05-19 DIAGNOSIS — I739 Peripheral vascular disease, unspecified: Secondary | ICD-10-CM | POA: Diagnosis not present

## 2021-05-19 DIAGNOSIS — M79605 Pain in left leg: Secondary | ICD-10-CM | POA: Diagnosis not present

## 2021-05-19 DIAGNOSIS — M79604 Pain in right leg: Secondary | ICD-10-CM | POA: Diagnosis not present

## 2021-05-23 DIAGNOSIS — E663 Overweight: Secondary | ICD-10-CM | POA: Diagnosis not present

## 2021-05-23 DIAGNOSIS — I25119 Atherosclerotic heart disease of native coronary artery with unspecified angina pectoris: Secondary | ICD-10-CM | POA: Diagnosis not present

## 2021-05-23 DIAGNOSIS — E785 Hyperlipidemia, unspecified: Secondary | ICD-10-CM | POA: Diagnosis not present

## 2021-05-23 DIAGNOSIS — Z008 Encounter for other general examination: Secondary | ICD-10-CM | POA: Diagnosis not present

## 2021-05-23 DIAGNOSIS — I69344 Monoplegia of lower limb following cerebral infarction affecting left non-dominant side: Secondary | ICD-10-CM | POA: Diagnosis not present

## 2021-05-23 DIAGNOSIS — I252 Old myocardial infarction: Secondary | ICD-10-CM | POA: Diagnosis not present

## 2021-05-23 DIAGNOSIS — E1151 Type 2 diabetes mellitus with diabetic peripheral angiopathy without gangrene: Secondary | ICD-10-CM | POA: Diagnosis not present

## 2021-05-23 DIAGNOSIS — R69 Illness, unspecified: Secondary | ICD-10-CM | POA: Diagnosis not present

## 2021-05-23 DIAGNOSIS — E039 Hypothyroidism, unspecified: Secondary | ICD-10-CM | POA: Diagnosis not present

## 2021-05-23 DIAGNOSIS — N400 Enlarged prostate without lower urinary tract symptoms: Secondary | ICD-10-CM | POA: Diagnosis not present

## 2021-05-23 DIAGNOSIS — I10 Essential (primary) hypertension: Secondary | ICD-10-CM | POA: Diagnosis not present

## 2021-05-23 DIAGNOSIS — K219 Gastro-esophageal reflux disease without esophagitis: Secondary | ICD-10-CM | POA: Diagnosis not present

## 2021-07-15 DIAGNOSIS — N401 Enlarged prostate with lower urinary tract symptoms: Secondary | ICD-10-CM | POA: Diagnosis not present

## 2021-07-15 DIAGNOSIS — R1031 Right lower quadrant pain: Secondary | ICD-10-CM | POA: Diagnosis not present

## 2021-07-15 DIAGNOSIS — N138 Other obstructive and reflux uropathy: Secondary | ICD-10-CM | POA: Diagnosis not present

## 2021-08-17 DIAGNOSIS — E782 Mixed hyperlipidemia: Secondary | ICD-10-CM | POA: Diagnosis not present

## 2021-08-17 DIAGNOSIS — E1169 Type 2 diabetes mellitus with other specified complication: Secondary | ICD-10-CM | POA: Diagnosis not present

## 2021-08-17 DIAGNOSIS — I10 Essential (primary) hypertension: Secondary | ICD-10-CM | POA: Diagnosis not present

## 2021-08-17 DIAGNOSIS — J449 Chronic obstructive pulmonary disease, unspecified: Secondary | ICD-10-CM | POA: Diagnosis not present

## 2021-11-18 DIAGNOSIS — E782 Mixed hyperlipidemia: Secondary | ICD-10-CM | POA: Diagnosis not present

## 2021-11-18 DIAGNOSIS — R69 Illness, unspecified: Secondary | ICD-10-CM | POA: Diagnosis not present

## 2021-11-18 DIAGNOSIS — I10 Essential (primary) hypertension: Secondary | ICD-10-CM | POA: Diagnosis not present

## 2021-11-18 DIAGNOSIS — E1169 Type 2 diabetes mellitus with other specified complication: Secondary | ICD-10-CM | POA: Diagnosis not present

## 2021-12-16 DIAGNOSIS — J00 Acute nasopharyngitis [common cold]: Secondary | ICD-10-CM | POA: Diagnosis not present

## 2022-02-06 DIAGNOSIS — I10 Essential (primary) hypertension: Secondary | ICD-10-CM | POA: Diagnosis not present

## 2022-02-06 DIAGNOSIS — E785 Hyperlipidemia, unspecified: Secondary | ICD-10-CM | POA: Diagnosis not present

## 2022-02-06 DIAGNOSIS — E119 Type 2 diabetes mellitus without complications: Secondary | ICD-10-CM | POA: Diagnosis not present

## 2022-02-06 DIAGNOSIS — R001 Bradycardia, unspecified: Secondary | ICD-10-CM | POA: Diagnosis not present

## 2022-02-06 DIAGNOSIS — I200 Unstable angina: Secondary | ICD-10-CM | POA: Diagnosis not present

## 2022-02-06 DIAGNOSIS — I739 Peripheral vascular disease, unspecified: Secondary | ICD-10-CM | POA: Diagnosis not present

## 2022-02-16 DIAGNOSIS — I77811 Abdominal aortic ectasia: Secondary | ICD-10-CM | POA: Diagnosis not present

## 2022-02-16 DIAGNOSIS — I739 Peripheral vascular disease, unspecified: Secondary | ICD-10-CM | POA: Diagnosis not present

## 2022-02-16 DIAGNOSIS — I70201 Unspecified atherosclerosis of native arteries of extremities, right leg: Secondary | ICD-10-CM | POA: Diagnosis not present

## 2022-02-16 DIAGNOSIS — K5730 Diverticulosis of large intestine without perforation or abscess without bleeding: Secondary | ICD-10-CM | POA: Diagnosis not present

## 2022-02-21 DIAGNOSIS — I440 Atrioventricular block, first degree: Secondary | ICD-10-CM | POA: Diagnosis not present

## 2022-02-21 DIAGNOSIS — I200 Unstable angina: Secondary | ICD-10-CM | POA: Diagnosis not present

## 2022-02-21 DIAGNOSIS — Z7984 Long term (current) use of oral hypoglycemic drugs: Secondary | ICD-10-CM | POA: Diagnosis not present

## 2022-02-21 DIAGNOSIS — I2510 Atherosclerotic heart disease of native coronary artery without angina pectoris: Secondary | ICD-10-CM | POA: Diagnosis not present

## 2022-02-21 DIAGNOSIS — E785 Hyperlipidemia, unspecified: Secondary | ICD-10-CM | POA: Diagnosis not present

## 2022-02-21 DIAGNOSIS — E039 Hypothyroidism, unspecified: Secondary | ICD-10-CM | POA: Diagnosis not present

## 2022-02-21 DIAGNOSIS — Z95828 Presence of other vascular implants and grafts: Secondary | ICD-10-CM | POA: Diagnosis not present

## 2022-02-21 DIAGNOSIS — I10 Essential (primary) hypertension: Secondary | ICD-10-CM | POA: Diagnosis not present

## 2022-02-21 DIAGNOSIS — Z01810 Encounter for preprocedural cardiovascular examination: Secondary | ICD-10-CM | POA: Diagnosis not present

## 2022-02-21 DIAGNOSIS — R079 Chest pain, unspecified: Secondary | ICD-10-CM | POA: Diagnosis not present

## 2022-02-21 DIAGNOSIS — G4733 Obstructive sleep apnea (adult) (pediatric): Secondary | ICD-10-CM | POA: Diagnosis not present

## 2022-02-21 DIAGNOSIS — R001 Bradycardia, unspecified: Secondary | ICD-10-CM | POA: Diagnosis not present

## 2022-02-21 DIAGNOSIS — Z7982 Long term (current) use of aspirin: Secondary | ICD-10-CM | POA: Diagnosis not present

## 2022-02-21 DIAGNOSIS — I739 Peripheral vascular disease, unspecified: Secondary | ICD-10-CM | POA: Diagnosis not present

## 2022-02-21 DIAGNOSIS — J449 Chronic obstructive pulmonary disease, unspecified: Secondary | ICD-10-CM | POA: Diagnosis not present

## 2022-02-21 DIAGNOSIS — E119 Type 2 diabetes mellitus without complications: Secondary | ICD-10-CM | POA: Diagnosis not present

## 2022-02-21 DIAGNOSIS — Z79899 Other long term (current) drug therapy: Secondary | ICD-10-CM | POA: Diagnosis not present

## 2022-02-21 DIAGNOSIS — Z87891 Personal history of nicotine dependence: Secondary | ICD-10-CM | POA: Diagnosis not present

## 2022-03-13 ENCOUNTER — Other Ambulatory Visit: Payer: Self-pay

## 2022-03-13 MED ORDER — FUROSEMIDE 40 MG PO TABS: 40.0000 mg | ORAL_TABLET | Freq: Every day | ORAL | 2 refills | Status: DC

## 2022-03-13 MED ORDER — POTASSIUM CHLORIDE CRYS ER 20 MEQ PO TBCR: 20.0000 meq | EXTENDED_RELEASE_TABLET | Freq: Every day | ORAL | 2 refills | Status: DC

## 2022-03-13 MED ORDER — AMLODIPINE BESYLATE 2.5 MG PO TABS: 2.5000 mg | ORAL_TABLET | Freq: Every day | ORAL | 2 refills | Status: DC

## 2022-03-20 ENCOUNTER — Other Ambulatory Visit: Payer: Self-pay

## 2022-03-20 ENCOUNTER — Other Ambulatory Visit: Payer: Self-pay | Admitting: Internal Medicine

## 2022-03-20 MED ORDER — METFORMIN HCL ER 500 MG PO TB24: 500.0000 mg | ORAL_TABLET | Freq: Two times a day (BID) | ORAL | 2 refills | Status: DC

## 2022-03-20 MED ORDER — TAMSULOSIN HCL 0.4 MG PO CAPS: 0.8000 mg | ORAL_CAPSULE | Freq: Every day | ORAL | 2 refills | Status: DC

## 2022-03-20 MED ORDER — OMEPRAZOLE 40 MG PO CPDR: DELAYED_RELEASE_CAPSULE | ORAL | 2 refills | Status: DC

## 2022-03-20 MED ORDER — CLOTRIMAZOLE-BETAMETHASONE 1-0.05 % EX CREA: 1.0000 | TOPICAL_CREAM | Freq: Two times a day (BID) | CUTANEOUS | 0 refills | Status: DC

## 2022-03-20 MED ORDER — FENOFIBRATE 145 MG PO TABS: 145.0000 mg | ORAL_TABLET | Freq: Every day | ORAL | 2 refills | Status: DC

## 2022-03-20 MED ORDER — DIVALPROEX SODIUM ER 250 MG PO TB24: 250.0000 mg | ORAL_TABLET | Freq: Every day | ORAL | 2 refills | Status: DC

## 2022-03-23 ENCOUNTER — Other Ambulatory Visit: Payer: Self-pay

## 2022-03-23 DIAGNOSIS — R69 Illness, unspecified: Secondary | ICD-10-CM | POA: Diagnosis not present

## 2022-03-23 DIAGNOSIS — J449 Chronic obstructive pulmonary disease, unspecified: Secondary | ICD-10-CM | POA: Diagnosis not present

## 2022-03-23 DIAGNOSIS — Z008 Encounter for other general examination: Secondary | ICD-10-CM | POA: Diagnosis not present

## 2022-03-23 DIAGNOSIS — G4733 Obstructive sleep apnea (adult) (pediatric): Secondary | ICD-10-CM | POA: Diagnosis not present

## 2022-03-23 DIAGNOSIS — N400 Enlarged prostate without lower urinary tract symptoms: Secondary | ICD-10-CM | POA: Diagnosis not present

## 2022-03-23 DIAGNOSIS — I10 Essential (primary) hypertension: Secondary | ICD-10-CM | POA: Diagnosis not present

## 2022-03-23 DIAGNOSIS — E1151 Type 2 diabetes mellitus with diabetic peripheral angiopathy without gangrene: Secondary | ICD-10-CM | POA: Diagnosis not present

## 2022-03-23 DIAGNOSIS — I25119 Atherosclerotic heart disease of native coronary artery with unspecified angina pectoris: Secondary | ICD-10-CM | POA: Diagnosis not present

## 2022-03-23 DIAGNOSIS — E785 Hyperlipidemia, unspecified: Secondary | ICD-10-CM | POA: Diagnosis not present

## 2022-03-23 DIAGNOSIS — N529 Male erectile dysfunction, unspecified: Secondary | ICD-10-CM | POA: Diagnosis not present

## 2022-03-23 DIAGNOSIS — E039 Hypothyroidism, unspecified: Secondary | ICD-10-CM | POA: Diagnosis not present

## 2022-03-23 DIAGNOSIS — F319 Bipolar disorder, unspecified: Secondary | ICD-10-CM | POA: Diagnosis not present

## 2022-03-23 DIAGNOSIS — K219 Gastro-esophageal reflux disease without esophagitis: Secondary | ICD-10-CM | POA: Diagnosis not present

## 2022-03-23 MED ORDER — FINASTERIDE 5 MG PO TABS: 5.0000 mg | ORAL_TABLET | Freq: Every day | ORAL | 3 refills | Status: DC

## 2022-03-23 MED ORDER — LOSARTAN POTASSIUM 50 MG PO TABS: 50.0000 mg | ORAL_TABLET | Freq: Every day | ORAL | 1 refills | Status: DC

## 2022-04-10 DIAGNOSIS — I739 Peripheral vascular disease, unspecified: Secondary | ICD-10-CM | POA: Diagnosis not present

## 2022-04-17 ENCOUNTER — Other Ambulatory Visit: Payer: Self-pay | Admitting: Internal Medicine

## 2022-04-17 MED ORDER — ALPRAZOLAM 1 MG PO TABS: 1.0000 mg | ORAL_TABLET | Freq: Two times a day (BID) | ORAL | 2 refills | Status: DC | PRN

## 2022-04-17 NOTE — Telephone Encounter (Signed)
Dr Reesa Chew already sent

## 2022-04-20 DIAGNOSIS — I739 Peripheral vascular disease, unspecified: Secondary | ICD-10-CM | POA: Diagnosis not present

## 2022-04-24 ENCOUNTER — Ambulatory Visit: Payer: Medicare HMO | Admitting: Internal Medicine

## 2022-04-24 ENCOUNTER — Encounter: Payer: Self-pay | Admitting: Internal Medicine

## 2022-04-24 VITALS — BP 132/78 | HR 96 | Temp 97.7°F | Resp 18 | Ht 67.5 in | Wt 188.2 lb

## 2022-04-24 DIAGNOSIS — R972 Elevated prostate specific antigen [PSA]: Secondary | ICD-10-CM

## 2022-04-24 DIAGNOSIS — N400 Enlarged prostate without lower urinary tract symptoms: Secondary | ICD-10-CM

## 2022-04-24 DIAGNOSIS — E785 Hyperlipidemia, unspecified: Secondary | ICD-10-CM | POA: Diagnosis not present

## 2022-04-24 DIAGNOSIS — F319 Bipolar disorder, unspecified: Secondary | ICD-10-CM | POA: Insufficient documentation

## 2022-04-24 DIAGNOSIS — I10 Essential (primary) hypertension: Secondary | ICD-10-CM

## 2022-04-24 DIAGNOSIS — I739 Peripheral vascular disease, unspecified: Secondary | ICD-10-CM

## 2022-04-24 DIAGNOSIS — E1169 Type 2 diabetes mellitus with other specified complication: Secondary | ICD-10-CM | POA: Diagnosis not present

## 2022-04-24 DIAGNOSIS — E039 Hypothyroidism, unspecified: Secondary | ICD-10-CM | POA: Diagnosis not present

## 2022-04-24 DIAGNOSIS — F3178 Bipolar disorder, in full remission, most recent episode mixed: Secondary | ICD-10-CM

## 2022-04-24 NOTE — Assessment & Plan Note (Signed)
stable °

## 2022-04-24 NOTE — Assessment & Plan Note (Signed)
His last lab drawn was 3/23 and he is due for labs today.

## 2022-04-24 NOTE — Assessment & Plan Note (Signed)
Controlled.  

## 2022-04-24 NOTE — Assessment & Plan Note (Signed)
I will refer him to see Dr. Nila Nephew

## 2022-04-24 NOTE — Progress Notes (Signed)
Office Visit  Subjective   Patient ID: Bryan Mccormick   DOB: [birth year]   Age: 67 y.o.   MRN: [id number]   Chief Complaint Chief Complaint  Patient presents with   Follow-up    Follow up     History of Present Illness The patient is a 67 year old Caucasian/White male who presents for a FOLLOW-UP. The patient has not been checking his blood pressure at home. The patient's current medications include: AMLODIPINE 2.'5MG'$ , LOSARTAN POT '50MG'$ , and potassium chloride ER 20 mEq tablet,extended release(part/cryst). The patient has been tolerating his medications well. The patient denies any chest pain, shortness of breath, orthopnea, and PND. His last eye exam was in May 19,22 ago.  He reports there have been no other symptoms noted.  The patient is a 67 year old Caucasian/White male who presents with no specific complaint.  His blood sugar was measured at does not check mg/dl at home. His last hemoglobin A1c was 5.9% in March 23.  He denies any further significant symptoms. He denies foot ulcers, muscle weakness, numbness in her lower extremities, pain in feet, and swollen feet.  He states he exercises occasionally. He has eye examination on 07/15/20 and no diabetic changes were noted.  The patient's past medical history is: Asthma, Cerebrovascular Accident (CVA), Chronic Obstructive Pulmonary Disease, Hyperlipidemia, Hypertension, and DM. There is no history of foot ulcers and pancreatitis.   He does not do regular foot exam.  The patient is a 67 year old Caucasian/White male who returns for a regularly scheduled thyroid check. Since the last visit, 08/17/2021 there has been no overall change in his status. He remains on levothyroxine 75 mcg oral tablet. He claims to have no symptoms suggestive of thyroid imbalance specifically denying fatigue, cold intolerance, heat intolerance, tremors, anxiety, unexplained weight changes, and insomnia.  Bryan Mccormick returns today for routine followup on his  cholesterol. Overall, he states he is doing well and is without any complaints or problems at this time. He specifically denies chest pain, abdominal pain, nausea, diarrhea, and myalgias. He remains on dietary management as well as the following cholesterol lowering medications ATORVASTATIN '80MG'$  and FENOFIBRATE '145MG'$ .       He has PVD with stent placement in left leg 15 years ago in [facility]. We did ABI and right leg 0.85 and left leg where he has stent placed was 1.0. He can not walk one block but has to stop because of pain.    He says that his COPD is stable, he use ProAir and sometime use nebulizer treatment.    He was diagnosed with bipolar disorder and his symptoms are controlled.    He has difficulty urinating and he is on flomax and finasteride. His PSA was high and he was seen by [facility] and he want to see Dr. Nila Nephew here.    Past Medical History No past medical history on file.   Allergies Not on File   Review of Systems Review of Systems  Constitutional: Negative.   HENT: Negative.    Respiratory: Negative.    Cardiovascular: Negative.   Gastrointestinal: Negative.   Neurological: Negative.        Objective:    Vitals BP 132/78 (BP Location: Left Arm, Patient Position: Sitting, Cuff Size: Normal)   Pulse 96   Temp 97.7 F (36.5 C)   Resp 18   Ht 5' 7.5" (1.715 m)   Wt 188 lb 4 oz (85.4 kg)   SpO2 99%  BMI 29.05 kg/m    Physical Examination Physical Exam Constitutional:      Appearance: Normal appearance. He is normal weight.  HENT:     Head: Normocephalic and atraumatic.  Eyes:     Extraocular Movements: Extraocular movements intact.     Pupils: Pupils are equal, round, and reactive to light.  Cardiovascular:     Rate and Rhythm: Normal rate and regular rhythm.     Heart sounds: Normal heart sounds.  Pulmonary:     Effort: Pulmonary effort is normal.     Breath sounds: Normal breath sounds.  Abdominal:     General: Bowel sounds are  normal.     Palpations: Abdomen is soft.  Neurological:     General: No focal deficit present.     Mental Status: He is alert and oriented to person, place, and time.        Assessment & Plan:   Dyslipidemia associated with type 2 diabetes mellitus ([place]) His last lab drawn was 3/23 and he is due for labs today.   Hypertension stable  Hypothyroidism (acquired) Will do TSH today.  PAD (peripheral artery disease) ([place]) He is following with vascular surgeon at [facility].   Bipolar disorder (manic depression) ([place]) Controlled.  BPH with elevated PSA I will refer him to see Dr. Nila Nephew    Return in about 3 months (around 07/23/2022).   Garwin Brothers, MD

## 2022-04-24 NOTE — Assessment & Plan Note (Signed)
Will do TSH today.

## 2022-04-24 NOTE — Assessment & Plan Note (Signed)
He is following with vascular surgeon at [facility].

## 2022-04-25 LAB — LIPID PANEL
Chol/HDL Ratio: 3.1 ratio (ref 0.0–5.0)
Cholesterol, Total: 173 mg/dL (ref 100–199)
HDL: 56 mg/dL (ref >39)
LDL Chol Calc (NIH): 92 mg/dL (ref 0–99)
Triglycerides: 142 mg/dL (ref 0–149)
VLDL Cholesterol Cal: 25 mg/dL (ref 5–40)

## 2022-04-25 LAB — CMP14 + ANION GAP
ALT: 26 IU/L (ref 0–44)
AST: 19 IU/L (ref 0–40)
Albumin/Globulin Ratio: 2.2 (ref 1.2–2.2)
Albumin: 4.6 g/dL (ref 3.9–4.9)
Alkaline Phosphatase: 59 IU/L (ref 44–121)
Anion Gap: 14 mmol/L (ref 10.0–18.0)
BUN/Creatinine Ratio: 18 (ref 10–24)
BUN: 22 mg/dL (ref 8–27)
Bilirubin Total: 0.3 mg/dL (ref 0.0–1.2)
CO2: 24 mmol/L (ref 20–29)
Calcium: 9.6 mg/dL (ref 8.6–10.2)
Chloride: 106 mmol/L (ref 96–106)
Creatinine, Ser: 1.21 mg/dL (ref 0.76–1.27)
Globulin, Total: 2.1 g/dL (ref 1.5–4.5)
Glucose: 96 mg/dL (ref 70–99)
Potassium: 5.1 mmol/L (ref 3.5–5.2)
Sodium: 144 mmol/L (ref 134–144)
Total Protein: 6.7 g/dL (ref 6.0–8.5)
eGFR: 66 mL/min/{1.73_m2} (ref >59)

## 2022-04-25 LAB — HEMOGLOBIN A1C
Est. average glucose Bld gHb Est-mCnc: 126 mg/dL
Hgb A1c MFr Bld: 6 % — ABNORMAL HIGH (ref 4.8–5.6)

## 2022-04-25 LAB — TSH: TSH: 2.1 u[IU]/mL (ref 0.450–4.500)

## 2022-04-25 LAB — MICROALBUMIN / CREATININE URINE RATIO
Creatinine, Urine: 141.2 mg/dL
Microalb/Creat Ratio: 17 mg/g creat (ref 0–29)
Microalbumin, Urine: 24.6 ug/mL

## 2022-06-05 ENCOUNTER — Encounter: Payer: Self-pay | Admitting: Internal Medicine

## 2022-06-05 ENCOUNTER — Ambulatory Visit: Payer: Medicare HMO | Admitting: Internal Medicine

## 2022-06-05 VITALS — BP 130/78 | HR 80 | Temp 98.0°F | Resp 18 | Ht 68.0 in | Wt 187.4 lb

## 2022-06-05 DIAGNOSIS — R059 Cough, unspecified: Secondary | ICD-10-CM

## 2022-06-05 DIAGNOSIS — J069 Acute upper respiratory infection, unspecified: Secondary | ICD-10-CM

## 2022-06-05 LAB — POC COVID19 BINAXNOW: SARS Coronavirus 2 Ag: NEGATIVE

## 2022-06-05 MED ORDER — SALINE NASAL SPRAY 0.65 % NA SOLN: 1.0000 | NASAL | 12 refills | Status: DC | PRN

## 2022-06-05 MED ORDER — GUAIFENESIN-CODEINE 100-10 MG/5ML PO SOLN: 10.0000 mL | Freq: Three times a day (TID) | ORAL | 0 refills | Status: DC | PRN

## 2022-06-05 MED ORDER — FEXOFENADINE HCL 180 MG PO TABS: 180.0000 mg | ORAL_TABLET | Freq: Every day | ORAL | 0 refills | Status: DC

## 2022-06-05 NOTE — Assessment & Plan Note (Signed)
I will send  cough medicine, if his cough get worse then he will call.

## 2022-06-05 NOTE — Assessment & Plan Note (Signed)
His COVID test is negative.  I will send saline nasal spray and fexofenadine.

## 2022-06-05 NOTE — Progress Notes (Signed)
   Acute Office Visit  Subjective:     Patient ID: Bryan Mccormick, male    DOB: [birth year], 67 y.o.   MRN: [id number]  Chief Complaint  Patient presents with   office visit    Patient feeling fatigue, coughing for 2 days     HPI Patient is in today for upper respiratory symptoms, he says his wife is very sick and he has been taking care of her and did not slept 2-3 hours per night for 3 days and now he noted stuffy nose and cough. He denies any wheeze. No fever or chills.  Review of Systems  HENT:  Positive for congestion and sore throat.   Respiratory:  Positive for cough.   Cardiovascular: Negative.         Objective:    BP 130/78 (BP Location: Right Arm, Patient Position: Sitting, Cuff Size: Normal)   Pulse 80   Temp 98 F (36.7 C)   Resp 18   Ht 5\' 8"  (1.727 m)   Wt 187 lb 6 oz (85 kg)   SpO2 93%   BMI 28.49 kg/m    Physical Exam Constitutional:      Appearance: Normal appearance.  Eyes:     Extraocular Movements: Extraocular movements intact.     Pupils: Pupils are equal, round, and reactive to light.  Cardiovascular:     Rate and Rhythm: Normal rate and regular rhythm.     Heart sounds: Normal heart sounds.  Pulmonary:     Effort: Pulmonary effort is normal.     Breath sounds: Normal breath sounds.  Neurological:     Mental Status: He is alert.     Results for orders placed or performed in visit on 06/05/22  POC COVID-19 BinaxNow  Result Value Ref Range   SARS Coronavirus 2 Ag Negative Negative        Assessment & Plan:   Problem List Items Addressed This Visit       Respiratory   Upper respiratory tract infection    His COVID test is negative.  I will send saline nasal spray and fexofenadine.        Other   Cough - Primary    I will send  cough medicine, if his cough get worse then he will call.       Relevant Orders   POC COVID-19 BinaxNow (Completed)    No orders of the defined types were placed in this encounter.   No  follow-ups on file.  Eloisa Northern, MD

## 2022-06-12 ENCOUNTER — Other Ambulatory Visit: Payer: Self-pay | Admitting: Internal Medicine

## 2022-06-28 DIAGNOSIS — R972 Elevated prostate specific antigen [PSA]: Secondary | ICD-10-CM | POA: Diagnosis not present

## 2022-06-28 DIAGNOSIS — R339 Retention of urine, unspecified: Secondary | ICD-10-CM | POA: Diagnosis not present

## 2022-06-28 DIAGNOSIS — N401 Enlarged prostate with lower urinary tract symptoms: Secondary | ICD-10-CM | POA: Diagnosis not present

## 2022-07-06 DIAGNOSIS — N310 Uninhibited neuropathic bladder, not elsewhere classified: Secondary | ICD-10-CM | POA: Diagnosis not present

## 2022-07-06 DIAGNOSIS — N1330 Unspecified hydronephrosis: Secondary | ICD-10-CM | POA: Diagnosis not present

## 2022-07-06 DIAGNOSIS — R140 Abdominal distension (gaseous): Secondary | ICD-10-CM | POA: Diagnosis not present

## 2022-07-06 DIAGNOSIS — R339 Retention of urine, unspecified: Secondary | ICD-10-CM | POA: Diagnosis not present

## 2022-07-10 ENCOUNTER — Other Ambulatory Visit: Payer: Self-pay | Admitting: Internal Medicine

## 2022-07-18 ENCOUNTER — Other Ambulatory Visit: Payer: Self-pay | Admitting: Internal Medicine

## 2022-07-18 DIAGNOSIS — N289 Disorder of kidney and ureter, unspecified: Secondary | ICD-10-CM | POA: Diagnosis not present

## 2022-07-18 DIAGNOSIS — N1330 Unspecified hydronephrosis: Secondary | ICD-10-CM | POA: Diagnosis not present

## 2022-07-18 DIAGNOSIS — K5730 Diverticulosis of large intestine without perforation or abscess without bleeding: Secondary | ICD-10-CM | POA: Diagnosis not present

## 2022-07-18 MED ORDER — ALPRAZOLAM 1 MG PO TABS: 1.0000 mg | ORAL_TABLET | Freq: Two times a day (BID) | ORAL | 2 refills | Status: DC | PRN

## 2022-07-21 ENCOUNTER — Ambulatory Visit: Payer: Medicare HMO | Admitting: Internal Medicine

## 2022-07-26 ENCOUNTER — Encounter: Payer: Self-pay | Admitting: Internal Medicine

## 2022-07-26 ENCOUNTER — Ambulatory Visit: Payer: Medicare HMO | Admitting: Internal Medicine

## 2022-07-26 VITALS — BP 134/80 | HR 87 | Temp 97.7°F | Resp 18 | Wt 184.4 lb

## 2022-07-26 DIAGNOSIS — I739 Peripheral vascular disease, unspecified: Secondary | ICD-10-CM

## 2022-07-26 DIAGNOSIS — E039 Hypothyroidism, unspecified: Secondary | ICD-10-CM | POA: Diagnosis not present

## 2022-07-26 DIAGNOSIS — F3178 Bipolar disorder, in full remission, most recent episode mixed: Secondary | ICD-10-CM

## 2022-07-26 DIAGNOSIS — M25552 Pain in left hip: Secondary | ICD-10-CM

## 2022-07-26 DIAGNOSIS — E1169 Type 2 diabetes mellitus with other specified complication: Secondary | ICD-10-CM | POA: Diagnosis not present

## 2022-07-26 DIAGNOSIS — I10 Essential (primary) hypertension: Secondary | ICD-10-CM

## 2022-07-26 DIAGNOSIS — E785 Hyperlipidemia, unspecified: Secondary | ICD-10-CM | POA: Diagnosis not present

## 2022-07-26 DIAGNOSIS — G8929 Other chronic pain: Secondary | ICD-10-CM | POA: Diagnosis not present

## 2022-07-26 NOTE — Assessment & Plan Note (Signed)
His cholesterol and sugar been controlled. He could not see eye doctor because his wife is very sick and he did not have time to see eye doctor.

## 2022-07-26 NOTE — Assessment & Plan Note (Addendum)
He barely can walk due to left hip pain. I will refer him to see orthopedic.

## 2022-07-26 NOTE — Progress Notes (Signed)
Office Visit  Subjective   Patient ID: Bryan Mccormick   DOB: 1955-08-23   Age: 67 y.o.   MRN: [id number]   Chief Complaint Chief Complaint  Patient presents with   Follow-up    3 month follow up     History of Present Illness The patient is a 67 year old Caucasian/White male who presents for a follow up.   He is c/o increase pain in his left hip and he sometime can not put weight on it. He says that he can hear pop in his hip. He can not sleep at night because of hip pain as he move it hurt so much.   He has hypertension. The patient has not been checking his blood pressure at home. The patient's current medications include: AMLODIPINE 2.5MG , LOSARTAN POT 50MG , and potassium chloride ER 20 mEq tablet,extended release(part/cryst). The patient has been tolerating his medications well. The patient denies any chest pain, shortness of breath, orthopnea, and PND. He reports there have been no other symptoms noted.   The patient is a 67 year old Caucasian/White male who presents with no specific complaint.  His blood sugar was measured at does not check mg/dl at home. His last hemoglobin A1c was 6.0% in 2/24. He has not seen eye doctor for diabetic eye examination.    The patient's past medical history is: Asthma, Cerebrovascular Accident (CVA), Chronic Obstructive Pulmonary Disease, Hyperlipidemia, Hypertension, and DM. There is no history of foot ulcers and pancreatitis.   He does not do regular foot exam.  The patient is a 67 year old Caucasian/White male who does not have any sign of hypothyroidism or hyperthyroidism. His TSH in 04/24/22 was 2.1.   Bryan Mccormick returns today for routine followup on his cholesterol. Overall, he states he is doing well and is without any complaints or problems at this time. He specifically denies chest pain, abdominal pain, nausea, diarrhea, and myalgias. He remains on dietary management as well as the following cholesterol lowering medications ATORVASTATIN 80MG   and FENOFIBRATE 145MG .        He has PVD with stent placement in left leg 15 years ago in [facility]. We did ABI and right leg 0.85 and left leg where he has stent placed was 1.0. He can not walk one block but has to stop because of pain.     He says that his COPD is stable, he use ProAir and sometime use nebulizer treatment.     He was diagnosed with bipolar disorder and his symptoms are controlled.       Past Medical History No past medical history on file.   Allergies Allergies  Allergen Reactions   Niacin Itching     Review of Systems Review of Systems  Constitutional: Negative.   HENT: Negative.    Respiratory: Negative.    Cardiovascular: Negative.   Musculoskeletal:  Positive for joint pain.  Neurological: Negative.        Objective:    Vitals BP 134/80 (BP Location: Left Arm, Patient Position: Sitting, Cuff Size: Normal)   Pulse 87   Temp 97.7 F (36.5 C)   Resp 18   Wt 184 lb 6 oz (83.6 kg)   SpO2 97%   BMI 28.03 kg/m    Physical Examination Physical Exam Constitutional:      Appearance: Normal appearance.  HENT:     Head: Normocephalic and atraumatic.  Eyes:     Extraocular Movements: Extraocular movements intact.     Pupils: Pupils  are equal, round, and reactive to light.  Cardiovascular:     Rate and Rhythm: Normal rate and regular rhythm.     Heart sounds: Normal heart sounds.  Pulmonary:     Effort: Pulmonary effort is normal.     Breath sounds: Normal breath sounds.  Abdominal:     General: Bowel sounds are normal.     Palpations: Abdomen is soft.  Neurological:     Mental Status: He is alert and oriented to person, place, and time.        Assessment & Plan:   Dyslipidemia associated with type 2 diabetes mellitus (HCC) His cholesterol and sugar been controlled. He could not see eye doctor because his wife is very sick and he did not have time to see eye doctor.   Chronic left hip pain He barely can walk due to left hip pain. I  will refer him to see orthopedic.  Bipolar disorder (manic depression) (HCC) better    Return in about 3 months (around 10/26/2022).   Eloisa Northern, MD

## 2022-07-26 NOTE — Assessment & Plan Note (Signed)
better 

## 2022-08-03 DIAGNOSIS — M1612 Unilateral primary osteoarthritis, left hip: Secondary | ICD-10-CM | POA: Diagnosis not present

## 2022-08-10 DIAGNOSIS — M25552 Pain in left hip: Secondary | ICD-10-CM | POA: Diagnosis not present

## 2022-08-10 DIAGNOSIS — M1612 Unilateral primary osteoarthritis, left hip: Secondary | ICD-10-CM | POA: Diagnosis not present

## 2022-08-11 ENCOUNTER — Other Ambulatory Visit: Payer: Self-pay | Admitting: Internal Medicine

## 2022-08-11 ENCOUNTER — Other Ambulatory Visit: Payer: Self-pay

## 2022-08-28 DIAGNOSIS — M1610 Unilateral primary osteoarthritis, unspecified hip: Secondary | ICD-10-CM | POA: Diagnosis not present

## 2022-08-28 DIAGNOSIS — M1612 Unilateral primary osteoarthritis, left hip: Secondary | ICD-10-CM | POA: Diagnosis not present

## 2022-09-08 ENCOUNTER — Other Ambulatory Visit: Payer: Self-pay

## 2022-09-08 MED ORDER — LOSARTAN POTASSIUM 50 MG PO TABS: 50.0000 mg | ORAL_TABLET | Freq: Every day | ORAL | 1 refills | Status: DC

## 2022-09-11 ENCOUNTER — Other Ambulatory Visit: Payer: Self-pay

## 2022-09-11 MED ORDER — LEVOTHYROXINE SODIUM 75 MCG PO TABS: 75.0000 ug | ORAL_TABLET | Freq: Every day | ORAL | 2 refills | Status: DC

## 2022-09-20 IMAGING — MR MR PROSTATE WO/W CM
12 series · 48 of 48 positions shown · IV contrast (multihance)
Comparison: None.

CLINICAL DATA: Elevated PSA.  PSA equal 3.4.

EXAM:
MR PROSTATE WITHOUT AND WITH CONTRAST
TECHNIQUE: Multiplanar multisequence MRI images were obtained of the pelvis
centered about the prostate. Pre and post contrast images were
obtained.
CONTRAST:  18mL MULTIHANCE GADOBENATE DIMEGLUMINE 529 MG/ML IV SOLN

[Series 3: T2 · coronal · 3.0mm · 0.56mm/px · 1 of 23 slices shown (1 of 3)]
[im 1/23]
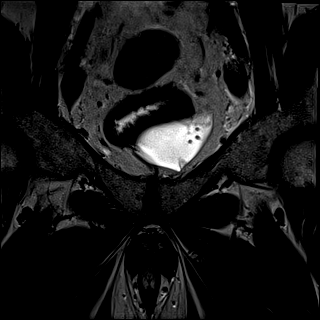

[Series 4: T1 · axial · 5.0mm · 1.25mm/px · 1 of 80 slices shown]
[im 1/80]
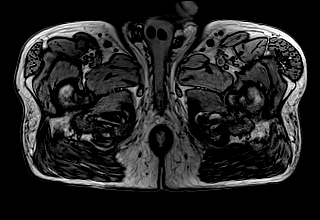

[Series 5: DWI · axial · 3.0mm · 1.75mm/px · z∈[-78,-3]mm · 2 of 78 slices shown (1 of 3)]
[im 1/78]
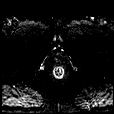
[im 78/78]
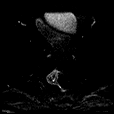

[Series 6: DWI · axial · 3.0mm · 1.75mm/px · 1 of 26 slices shown (2 of 3)]
[im 1/26]
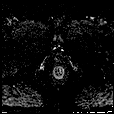

[Series 7: DWI · axial · 3.0mm · 1.75mm/px · 1 of 26 slices shown (3 of 3)]
[im 1/26]
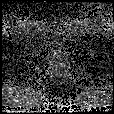

[Series 8: T2 · axial · 3.0mm · 0.56mm/px · 1 of 27 slices shown (2 of 3)]
[im 1/27]
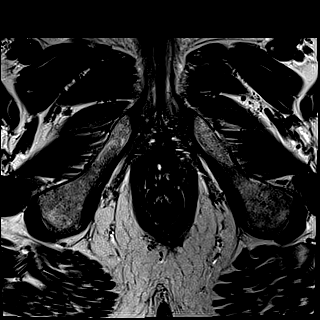

[Series 9: T2 · axial · 1.0mm · 1.04mm/px · z∈[-80,-1]mm · 2 of 80 slices shown (3 of 3)]
[im 1/80]
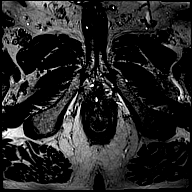
[im 80/80]
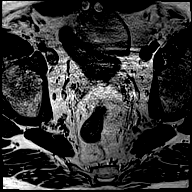

[Series 10: pre t1_twist_tra_dyn · axial · non-contrast · 3.5mm · 0.83mm/px · 1 of 22 slices shown]
[im 1/22]
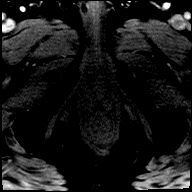

[Series 11: post t1_twist_tra_dyn-copy center · axial · non-contrast · 3.5mm · 0.83mm/px · z∈[-78,-4]mm · 17 of 660 slices shown]
[im 1/660]
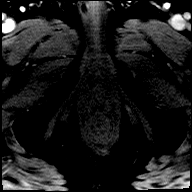
[im 42/660]
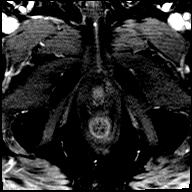
[im 83/660]
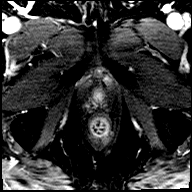
[im 124/660]
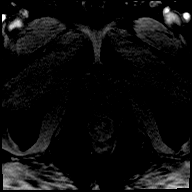
[im 165/660]
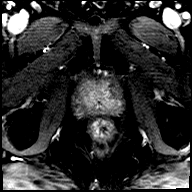
[im 206/660]
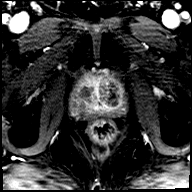
[im 248/660]
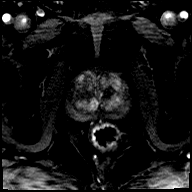
[im 289/660]
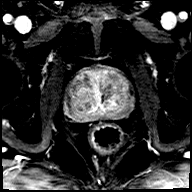
[im 330/660]
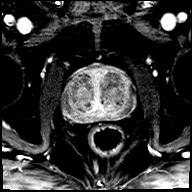
[im 371/660]
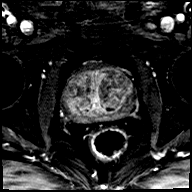
[im 412/660]
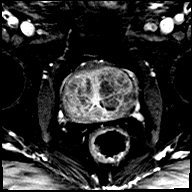
[im 454/660]
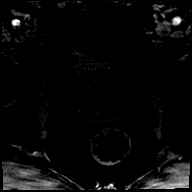
[im 495/660]
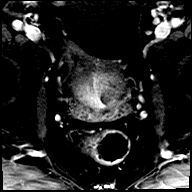
[im 536/660]
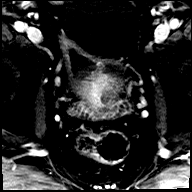
[im 577/660]
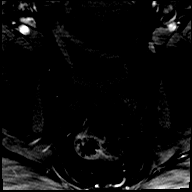
[im 618/660]
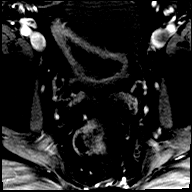
[im 660/660]
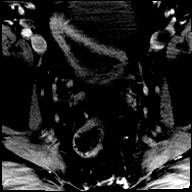

[Series 12: post t1_twist_tra_dyn-copy cent_sub · axial · 3.5mm · 0.83mm/px · z∈[-78,-4]mm · 17 of 638 slices shown]
[im 1/638]
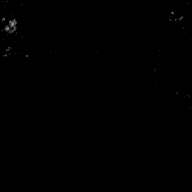
[im 40/638]
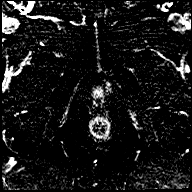
[im 80/638]
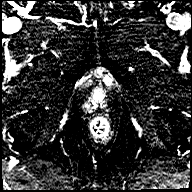
[im 120/638]
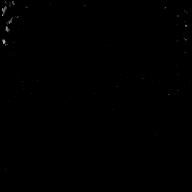
[im 160/638]
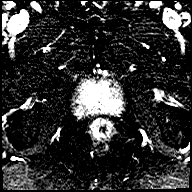
[im 200/638]
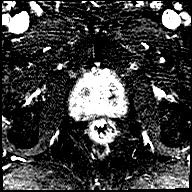
[im 239/638]
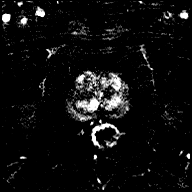
[im 279/638]
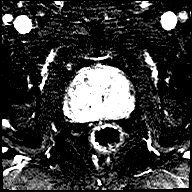
[im 319/638]
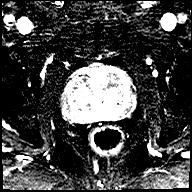
[im 359/638]
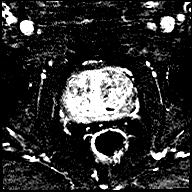
[im 399/638]
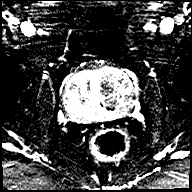
[im 438/638]
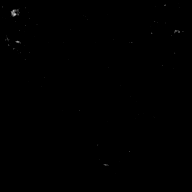
[im 478/638]
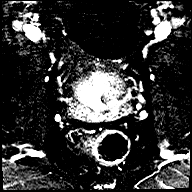
[im 518/638]
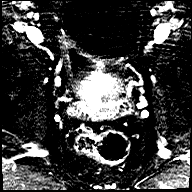
[im 558/638]
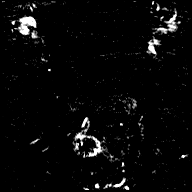
[im 598/638]
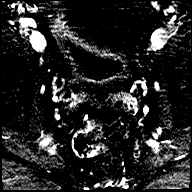
[im 638/638]
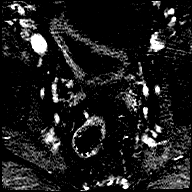

[Series 13: t1_vibe_dixon_tra_f · axial · 2.5mm · 0.91mm/px · z∈[-105,+92]mm · 2 of 80 slices shown]
[im 1/80]
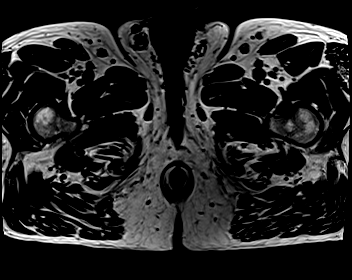
[im 80/80]
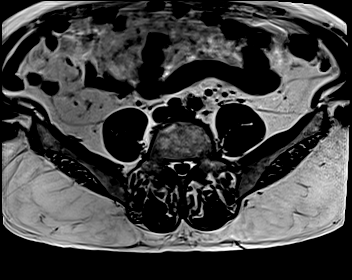

[Series 14: t1_vibe_dixon_tra_w · axial · 2.5mm · 0.91mm/px · z∈[-105,+92]mm · 2 of 80 slices shown]
[im 1/80]
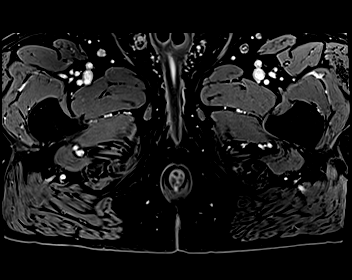
[im 80/80]
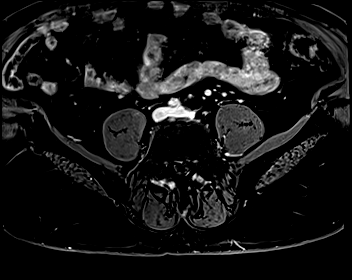

[48 of 48 positions shown; findings below may reference images not displayed]

FINDINGS: Prostate: No foci of restricted diffusion within the peripheral zone
(series 6). No focal lesion within the peripheral zone T2 weighted
imaging (series 8).

The transitional zone is enlarged well capsulated nodules without
suspicious imaging findings T2 weighted imaging.

No abnormal enhancement pattern.

Prostatic capsule is intact.  Seminal vesicles are normal.

Volume: 6.3 x 4.9 by 5.2 cm (volume = 84 cm^3)

Transcapsular spread:  Absent

Seminal vesicle involvement: Absent

Neurovascular bundle involvement: absent

Pelvic adenopathy: Absent

Bone metastasis: Absent

Other findings: None
IMPRESSION: 1. No high-grade carcinoma in the peripheral zone.  PI-RADS: 1.
2. Enlarged nodular transitional zone most consistent with benign
prostate hypertrophy. PI-RADS: 2.

## 2022-10-02 ENCOUNTER — Other Ambulatory Visit: Payer: Self-pay

## 2022-10-05 ENCOUNTER — Telehealth: Payer: Medicare HMO | Admitting: Internal Medicine

## 2022-10-06 ENCOUNTER — Telehealth: Payer: Medicare HMO | Admitting: Internal Medicine

## 2022-10-06 ENCOUNTER — Telehealth: Payer: Self-pay | Admitting: Internal Medicine

## 2022-10-06 DIAGNOSIS — J069 Acute upper respiratory infection, unspecified: Secondary | ICD-10-CM

## 2022-10-06 MED ORDER — IPRATROPIUM-ALBUTEROL 0.5-2.5 (3) MG/3ML IN SOLN: 3.0000 mL | Freq: Four times a day (QID) | RESPIRATORY_TRACT | 1 refills | Status: AC | PRN

## 2022-10-06 NOTE — Progress Notes (Signed)
   Acute Office Visit  Subjective:     Patient ID: Bryan Mccormick, male    DOB: 1955-07-11, 67 y.o.   MRN: [id number]  Nasal congestion.   HPI Patient was seen on his request via video tele visit. He is c/o  stuffy nose, watery eyes for the last 2 days. No fever or chills. No wheezing and no SOB. He denies any sick contact.   Review of Systems  Constitutional: Negative.   HENT:  Positive for congestion.   Respiratory:  Positive for cough. Negative for shortness of breath and wheezing.   Cardiovascular: Negative.         Objective:    There were no vitals taken for this visit.   Physical Exam Constitutional:      Appearance: Normal appearance.  Neurological:     Mental Status: He is alert.   This is a tele visit so no physical examination was done.  No results found for any visits on 10/06/22.      Assessment & Plan:   Problem List Items Addressed This Visit       Respiratory   Upper respiratory tract infection - Primary    I have discuss with patient to drink plenty of water, rest and symptoms treatment. If his symptoms persist by next week or get worse then he will call.       No orders of the defined types were placed in this encounter.   No follow-ups on file.  Eloisa Northern, MD

## 2022-10-09 ENCOUNTER — Other Ambulatory Visit: Payer: Self-pay | Admitting: Internal Medicine

## 2022-10-10 ENCOUNTER — Other Ambulatory Visit: Payer: Self-pay | Admitting: Internal Medicine

## 2022-10-10 MED ORDER — ALPRAZOLAM 1 MG PO TABS: 1.0000 mg | ORAL_TABLET | Freq: Two times a day (BID) | ORAL | 2 refills | Status: DC | PRN

## 2022-10-10 NOTE — Telephone Encounter (Signed)
I gave him the message thanks

## 2022-10-16 NOTE — Assessment & Plan Note (Signed)
I have discuss with patient to drink plenty of water, rest and symptoms treatment. If his symptoms persist by next week or get worse then he will call.

## 2022-10-18 ENCOUNTER — Other Ambulatory Visit: Payer: Self-pay | Admitting: Internal Medicine

## 2022-10-18 MED ORDER — AZITHROMYCIN 250 MG PO TABS: ORAL_TABLET | ORAL | 0 refills | Status: DC

## 2022-10-23 ENCOUNTER — Encounter: Payer: Self-pay | Admitting: Internal Medicine

## 2022-10-23 ENCOUNTER — Ambulatory Visit: Payer: Medicare HMO | Admitting: Internal Medicine

## 2022-10-23 VITALS — BP 126/80 | HR 62 | Temp 97.2°F | Resp 18 | Ht 68.0 in | Wt 182.4 lb

## 2022-10-23 DIAGNOSIS — E039 Hypothyroidism, unspecified: Secondary | ICD-10-CM | POA: Diagnosis not present

## 2022-10-23 DIAGNOSIS — I10 Essential (primary) hypertension: Secondary | ICD-10-CM | POA: Diagnosis not present

## 2022-10-23 DIAGNOSIS — F3178 Bipolar disorder, in full remission, most recent episode mixed: Secondary | ICD-10-CM | POA: Diagnosis not present

## 2022-10-23 DIAGNOSIS — E785 Hyperlipidemia, unspecified: Secondary | ICD-10-CM

## 2022-10-23 DIAGNOSIS — E1169 Type 2 diabetes mellitus with other specified complication: Secondary | ICD-10-CM | POA: Diagnosis not present

## 2022-10-23 DIAGNOSIS — I739 Peripheral vascular disease, unspecified: Secondary | ICD-10-CM | POA: Diagnosis not present

## 2022-10-23 MED ORDER — DIVALPROEX SODIUM ER 250 MG PO TB24: 250.0000 mg | ORAL_TABLET | Freq: Two times a day (BID) | ORAL | 2 refills | Status: DC

## 2022-10-23 MED ORDER — NITROGLYCERIN 0.4 MG SL SUBL: 0.4000 mg | SUBLINGUAL_TABLET | SUBLINGUAL | 6 refills | Status: AC | PRN

## 2022-10-23 NOTE — Assessment & Plan Note (Signed)
His labs and lipid panel is well controlled. He need to see eye doctor.

## 2022-10-23 NOTE — Assessment & Plan Note (Signed)
Stable

## 2022-10-23 NOTE — Assessment & Plan Note (Signed)
sstable

## 2022-10-23 NOTE — Assessment & Plan Note (Signed)
He will continue to follow with vascular surgeon.

## 2022-10-23 NOTE — Assessment & Plan Note (Signed)
Controlled on depakote

## 2022-10-23 NOTE — Addendum Note (Signed)
Addended byEloisa Northern on: 10/23/2022 09:51 AM   Modules accepted: Orders

## 2022-10-23 NOTE — Progress Notes (Signed)
Office Visit  Subjective   Patient ID: Bryan Mccormick   DOB: March 14, 1955   Age: 67 y.o.   MRN: [id number]   Chief Complaint Chief Complaint  Patient presents with   Follow up    Primary hypertension     History of Present Illness The patient is a 67 year old Caucasian/White male who presents for a FOLLOW-UP. He has chest infection and I have send antibiotic and he says that he is doing better now.   He says he saw vascular surgeon who told him that his circulation in left leg was low, he miss appointment last month. He was given some medicine and that did help per patient but he ran out of it now. He will follow up with them.  He has hypertension. The patient has not been checking his blood pressure at home. The patient's current medications include: AMLODIPINE 2.5MG , LOSARTAN POT 50MG , and potassium chloride ER 20 mEq tablet,extended release(part/cryst). The patient has been tolerating his medications well. The patient denies any chest pain, shortness of breath, orthopnea, and PND.   He reports there have been no other symptoms noted.  The patient is a 67 year old Caucasian/White male who presents with no specific complaint.  His blood sugar was measured at does not check mg/dl at home. His last hemoglobin A1c was 6.0% in February 2024.He has not seen eye doctor and he is due for diabetic eye examination.   He denies any further significant symptoms. He denies foot ulcers, muscle weakness, numbness in her lower extremities, pain in feet, and swollen feet.   The patient's past medical history is: Asthma, Cerebrovascular Accident (CVA), Chronic Obstructive Pulmonary Disease, Hyperlipidemia, Hypertension, and DM. There is no history of foot ulcers and pancreatitis.   The patient is a 67 year old Caucasian/White male who returns for a regularly scheduled thyroid check. Since the last visit, his TSH ws therapeutic 04/24/22. He remains on levothyroxine 75 mcg oral tablet. He claims to have no  symptoms suggestive of thyroid imbalance specifically denying fatigue, cold intolerance, heat intolerance, tremors, anxiety, unexplained weight changes, and insomnia.  Bryan Mccormick. Bryan Mccormick returns today for routine followup on his cholesterol. Overall, he states he is doing well and is without any complaints or problems at this time. He specifically denies chest pain, abdominal pain, nausea, diarrhea, and myalgias. He remains on dietary management as well as the following cholesterol lowering medications ATORVASTATIN 80MG  and FENOFIBRATE 145MG .        He says that his COPD is stable, he use ProAir and sometime use nebulizer treatment.     He was diagnosed with bipolar disorder and his symptoms are controlled.     He has difficulty urinating and he is on flomax and finasteride. He has missed appointment last week and he will see Dr. Saddie Benders in September.   Past Medical History No past medical history on file.   Allergies Allergies  Allergen Reactions   Niacin Itching     Review of Systems Review of Systems  Constitutional: Negative.   HENT: Negative.    Respiratory: Negative.    Cardiovascular: Negative.   Gastrointestinal: Negative.   Neurological: Negative.        Objective:    Vitals BP 126/80 (BP Location: Left Arm, Patient Position: Sitting, Cuff Size: Normal)   Pulse 62   Temp (!) 97.2 F (36.2 C)   Resp 18   Ht 5\' 8"  (1.727 m)   Wt 182 lb 6 oz (82.7 kg)  SpO2 99%   BMI 27.73 kg/m    Physical Examination Physical Exam Constitutional:      Appearance: Normal appearance.  HENT:     Head: Normocephalic and atraumatic.  Cardiovascular:     Rate and Rhythm: Normal rate and regular rhythm.     Heart sounds: Normal heart sounds.  Pulmonary:     Effort: Pulmonary effort is normal.     Breath sounds: Normal breath sounds.  Abdominal:     General: Bowel sounds are normal.     Palpations: Abdomen is soft.  Neurological:     General: No focal deficit present.     Mental  Status: He is alert and oriented to person, place, and time.        Assessment & Plan:   Hypertension Stable  PAD (peripheral artery disease) (HCC) He will continue to follow with vascular surgeon.  Dyslipidemia associated with type 2 diabetes mellitus (HCC) His labs and lipid panel is well controlled. He need to see eye doctor.  Hypothyroidism (acquired) sstable  Bipolar disorder (manic depression) (HCC) Controlled on depakote.    Return in about 3 months (around 01/23/2023).   Eloisa Northern, MD

## 2022-11-15 DIAGNOSIS — N401 Enlarged prostate with lower urinary tract symptoms: Secondary | ICD-10-CM | POA: Diagnosis not present

## 2022-11-15 DIAGNOSIS — R972 Elevated prostate specific antigen [PSA]: Secondary | ICD-10-CM | POA: Diagnosis not present

## 2022-11-29 ENCOUNTER — Other Ambulatory Visit: Payer: Self-pay | Admitting: Internal Medicine

## 2023-01-01 ENCOUNTER — Other Ambulatory Visit: Payer: Self-pay | Admitting: Internal Medicine

## 2023-01-01 MED ORDER — ALPRAZOLAM 1 MG PO TABS: 1.0000 mg | ORAL_TABLET | Freq: Two times a day (BID) | ORAL | 2 refills | Status: DC | PRN

## 2023-01-29 ENCOUNTER — Ambulatory Visit: Payer: Medicare HMO | Admitting: Internal Medicine

## 2023-01-29 ENCOUNTER — Encounter: Payer: Self-pay | Admitting: Internal Medicine

## 2023-01-29 VITALS — BP 184/88 | HR 66 | Temp 98.0°F | Resp 18 | Ht 68.0 in | Wt 188.5 lb

## 2023-01-29 DIAGNOSIS — I10 Essential (primary) hypertension: Secondary | ICD-10-CM

## 2023-01-29 LAB — CMP14 + ANION GAP
ALT: 15 [IU]/L (ref 0–44)
AST: 14 [IU]/L (ref 0–40)
Albumin: 4.3 g/dL (ref 3.9–4.9)
Alkaline Phosphatase: 55 [IU]/L (ref 44–121)
Anion Gap: 16 mmol/L (ref 10.0–18.0)
BUN/Creatinine Ratio: 24 (ref 10–24)
BUN: 25 mg/dL (ref 8–27)
Bilirubin Total: 0.4 mg/dL (ref 0.0–1.2)
CO2: 23 mmol/L (ref 20–29)
Calcium: 9.5 mg/dL (ref 8.6–10.2)
Chloride: 105 mmol/L (ref 96–106)
Creatinine, Ser: 1.04 mg/dL (ref 0.76–1.27)
Globulin, Total: 1.7 g/dL (ref 1.5–4.5)
Glucose: 111 mg/dL — ABNORMAL HIGH (ref 70–99)
Potassium: 4 mmol/L (ref 3.5–5.2)
Sodium: 144 mmol/L (ref 134–144)
Total Protein: 6 g/dL (ref 6.0–8.5)
eGFR: 78 mL/min/{1.73_m2} (ref >59)

## 2023-01-29 MED ORDER — LOSARTAN POTASSIUM-HCTZ 50-12.5 MG PO TABS: 1.0000 | ORAL_TABLET | Freq: Every day | ORAL | 11 refills | Status: DC

## 2023-01-29 MED ORDER — AMLODIPINE BESYLATE 5 MG PO TABS: 5.0000 mg | ORAL_TABLET | Freq: Every day | ORAL | 11 refills | Status: DC

## 2023-01-29 NOTE — Progress Notes (Signed)
   Acute Office Visit  Subjective:     Patient ID: Bryan Mccormick, male    DOB: [birth year], 67 y.o.   MRN: [id number]  Chief Complaint  Patient presents with   Hypertension    BP elevated    Hypertension Associated symptoms include shortness of breath.   Patient is in today for He has hypertension. He says that he was in somebody's house and he checked his blood pressure as he felt funny, his systolic blood pressure was 200/90. He takes his medications. No chest pain and no increase SOB. He took one extra losartan and is here to discuss his high blood pressure. The patient's current medications include: AMLODIPINE 2.5MG , LOSARTAN POT 50MG , and potassium chloride ER 20 mEq tablet,extended release(part/cryst). The patient has been tolerating his medications well. The patient denies any chest pain, shortness of breath, orthopnea, and PND.   Review of Systems  Constitutional: Negative.   HENT: Negative.    Respiratory:  Positive for shortness of breath.   Cardiovascular: Negative.   Gastrointestinal: Negative.   Neurological:  Positive for dizziness.        Objective:    BP (!) 184/88 (BP Location: Left Arm, Patient Position: Sitting, Cuff Size: Normal)   Pulse 66   Temp 98 F (36.7 C)   Resp 18   Ht 5\' 8"  (1.727 m)   Wt 188 lb 8 oz (85.5 kg)   SpO2 96%   BMI 28.66 kg/m    Physical Exam Constitutional:      Appearance: Normal appearance.  Cardiovascular:     Rate and Rhythm: Normal rate and regular rhythm.     Heart sounds: Normal heart sounds.  Pulmonary:     Effort: Pulmonary effort is normal.     Breath sounds: Normal breath sounds.  Abdominal:     General: Bowel sounds are normal.     Palpations: Abdomen is soft.  Neurological:     General: No focal deficit present.     Mental Status: He is alert and oriented to person, place, and time.     No results found for any visits on 01/29/23.      Assessment & Plan:   Problem List Items Addressed This Visit        Cardiovascular and Mediastinum   Hypertension - Primary    His blood pressure is high, I will change losartan to 50/12.5 mg hydrochlorothiazide and increase amlodipine to 5 mg daily. I will do CMP today. Because of dyslipidemia, diabetes and hypertension, I will enrol him in CCM.      I will also give him clonidine 0.1 mg now and will check his BP in 1 week.   No orders of the defined types were placed in this encounter.   Return in about 2 weeks (around 02/12/2023).  Eloisa Northern, MD

## 2023-01-29 NOTE — Addendum Note (Signed)
Addended by: Kvon Mcilhenny on: 01/29/2023 10:22 AM   Modules accepted: Orders

## 2023-01-29 NOTE — Assessment & Plan Note (Addendum)
His blood pressure is high, I will change losartan to 50/12.5 mg hydrochlorothiazide and increase amlodipine to 5 mg daily. I will do CMP today. Because of dyslipidemia, diabetes and hypertension, I will enrol him in CCM.

## 2023-02-09 ENCOUNTER — Ambulatory Visit: Payer: Medicare HMO | Admitting: Internal Medicine

## 2023-02-09 VITALS — BP 120/60 | HR 62 | Temp 97.4°F | Resp 18 | Ht 68.0 in | Wt 188.4 lb

## 2023-02-09 DIAGNOSIS — N400 Enlarged prostate without lower urinary tract symptoms: Secondary | ICD-10-CM | POA: Diagnosis not present

## 2023-02-09 DIAGNOSIS — I69354 Hemiplegia and hemiparesis following cerebral infarction affecting left non-dominant side: Secondary | ICD-10-CM | POA: Diagnosis not present

## 2023-02-09 DIAGNOSIS — M25552 Pain in left hip: Secondary | ICD-10-CM | POA: Diagnosis not present

## 2023-02-09 DIAGNOSIS — Z6828 Body mass index (BMI) 28.0-28.9, adult: Secondary | ICD-10-CM | POA: Diagnosis not present

## 2023-02-09 DIAGNOSIS — E039 Hypothyroidism, unspecified: Secondary | ICD-10-CM

## 2023-02-09 DIAGNOSIS — Z0000 Encounter for general adult medical examination without abnormal findings: Secondary | ICD-10-CM | POA: Insufficient documentation

## 2023-02-09 DIAGNOSIS — I739 Peripheral vascular disease, unspecified: Secondary | ICD-10-CM | POA: Diagnosis not present

## 2023-02-09 DIAGNOSIS — R972 Elevated prostate specific antigen [PSA]: Secondary | ICD-10-CM

## 2023-02-09 DIAGNOSIS — E1169 Type 2 diabetes mellitus with other specified complication: Secondary | ICD-10-CM

## 2023-02-09 DIAGNOSIS — E785 Hyperlipidemia, unspecified: Secondary | ICD-10-CM | POA: Diagnosis not present

## 2023-02-09 DIAGNOSIS — F3178 Bipolar disorder, in full remission, most recent episode mixed: Secondary | ICD-10-CM

## 2023-02-09 DIAGNOSIS — I10 Essential (primary) hypertension: Secondary | ICD-10-CM

## 2023-02-09 DIAGNOSIS — G8929 Other chronic pain: Secondary | ICD-10-CM

## 2023-02-09 DIAGNOSIS — J449 Chronic obstructive pulmonary disease, unspecified: Secondary | ICD-10-CM | POA: Diagnosis not present

## 2023-02-09 MED ORDER — TETANUS-DIPHTHERIA TOXOIDS TD 2-2 LF/0.5ML IM SUSP
0.5000 mL | Freq: Once | INTRAMUSCULAR | 0 refills | Status: AC
Start: 2023-02-09 — End: 2023-02-09

## 2023-02-09 MED ORDER — NYSTATIN 100000 UNIT/GM EX CREA: TOPICAL_CREAM | Freq: Two times a day (BID) | CUTANEOUS | 1 refills | Status: AC

## 2023-02-09 NOTE — Assessment & Plan Note (Signed)
Controlled.  

## 2023-02-09 NOTE — Assessment & Plan Note (Signed)
I will do TSH today.

## 2023-02-09 NOTE — Assessment & Plan Note (Signed)
His immunizations are up to date except need booster for tetanus vaccine and will send prescription for RSV vaccine. He need to see eye doctor.   Will schedule him for colonoscopy at the end of 2026. He will continue to follow with urologist.

## 2023-02-09 NOTE — Assessment & Plan Note (Signed)
Stable

## 2023-02-09 NOTE — Assessment & Plan Note (Signed)
He is stable and has nebulizer machine at home. I will send prescription for RSV vaccine to his pharmacy.

## 2023-02-09 NOTE — Assessment & Plan Note (Signed)
stable °

## 2023-02-09 NOTE — Assessment & Plan Note (Signed)
I will do PSA today.

## 2023-02-09 NOTE — Progress Notes (Signed)
Office Visit  Subjective   Patient ID: Bryan Mccormick   DOB: Feb 10, 1956   Age: 67 y.o.   MRN: [id number]   Chief Complaint Chief Complaint  Patient presents with   Annual Exam    Medicare annual wellness exam     History of Present Illness 67 years old male is here for annual physical examination. He lives with his wife. He does all ADL at home without any problem. He has slight weakness left side from previous stroke but is managing himself and look after his wife at home. He does not smoke and quit 8 years ago. He does not drink alcohol.   He drive, he denies any fall within last 1 year. He has flu shot this year, he has shingle vaccine and has pneumonia vaccine 10/2020. He need RSV and tetanus shot in the past and need booster now.  He has colonoscopy done 01/2020 by Dr. Jennye Boroughs and one polyp was removed, his next will be in 2026. He has BPH and follows with [facility].  He score 30/30 on MMSE.   He has arthritis and his hip pain is better after shot was given by Dr. Loralie Champagne this year. He also has chronic low back pain that does  not radiate.   He has hypertension. The patient was enrolled in CCM but he has not received BP apparatus yet. He will be checking his blood pressure at home. The patient's current medications include: AMLODIPINE 5 MG, LOSARTAN/hydrochlorothiazide 50-12.5 mg daily, and potassium chloride ER 20 mEq tablet,extended release(part/cryst). The patient has been tolerating his medications well. The patient denies any chest pain, shortness of breath, orthopnea, and PND. He reports there have been no other symptoms noted.   The patient is a 67 year old Caucasian/White male who has mild diabetes with last HbA1c was 6.0% in 04/24/2022. His last eye examination was 06/2020 and no diabetic changes were noted, he could not find another eye doctor who will accept his insurance.     The patient's past medical history is: Asthma, Cerebrovascular Accident (CVA), Chronic  Obstructive Pulmonary Disease, Hyperlipidemia, Hypertension, and DM. There is no history of foot ulcers and pancreatitis. He still has some weakness in his left side from previous stroke.   The patient is a 67 year old Caucasian/White male who does not have any sign of hypothyroidism or hyperthyroidism. His TSH in 04/24/22 was 2.1.   Bryan Mccormick returns today for routine followup on his cholesterol. Overall, he states he is doing well and is without any complaints or problems at this time. He specifically denies chest pain, abdominal pain, nausea, diarrhea, and myalgias. He remains on dietary management as well as the following cholesterol lowering medications ATORVASTATIN 80MG  and FENOFIBRATE 145MG .  He is fasting today for lab drawn.       He has PVD with stent placement in left leg 15 years ago in [facility]. We did ABI and right leg 0.85 and left leg where he has stent placed was 1.0. He can not walk one block but has to stop because of pain.     He says that his COPD is stable, he use ProAir and sometime use nebulizer treatment.     He was diagnosed with bipolar disorder and his symptoms are controlled.     Past Medical History No past medical history on file.   Allergies Allergies  Allergen Reactions   Niacin Itching     Review of Systems Review of Systems  Constitutional: Negative.  HENT:  Positive for congestion.   Respiratory: Negative.    Cardiovascular: Negative.   Gastrointestinal: Negative.   Musculoskeletal:  Positive for back pain.  Neurological: Negative.        Objective:    Vitals BP 120/60 (BP Location: Left Arm, Patient Position: Sitting)   Pulse 62   Temp (!) 97.4 F (36.3 C)   Resp 18   Ht 5\' 8"  (1.727 m)   Wt 188 lb 6 oz (85.4 kg)   SpO2 98%   BMI 28.64 kg/m    Physical Examination Physical Exam Constitutional:      Appearance: Normal appearance.  HENT:     Head: Normocephalic and atraumatic.  Cardiovascular:     Rate and Rhythm:  Normal rate and regular rhythm.     Heart sounds: Normal heart sounds.  Pulmonary:     Effort: Pulmonary effort is normal.     Breath sounds: Normal breath sounds.  Abdominal:     General: Bowel sounds are normal.     Palpations: Abdomen is soft.  Neurological:     Mental Status: He is alert and oriented to person, place, and time.     Comments: Slight weakness of left arm from previous stroke.         Assessment & Plan:   Hemiparesis affecting left side as late effect of cerebrovascular accident (CVA) (HCC) stable  COPD (chronic obstructive pulmonary disease) (HCC) He is stable and has nebulizer machine at home. I will send prescription for RSV vaccine to his pharmacy.  Hypertension Controlled.  PAD (peripheral artery disease) (HCC) stable  Dyslipidemia associated with type 2 diabetes mellitus (HCC) His LDL and sugar is target controlled. He need to see eye doctor for diabetic eye examination.  Hypothyroidism (acquired) I will do TSH today.  BPH with elevated PSA I will do PSA today.  Bipolar disorder (manic depression) (HCC) stable  Chronic left hip pain Stable.     Return in about 3 months (around 05/10/2023).   Eloisa Northern, MD

## 2023-02-09 NOTE — Assessment & Plan Note (Signed)
His LDL and sugar is target controlled. He need to see eye doctor for diabetic eye examination.

## 2023-02-10 LAB — CMP14 + ANION GAP
ALT: 14 [IU]/L (ref 0–44)
AST: 15 [IU]/L (ref 0–40)
Albumin: 4.4 g/dL (ref 3.9–4.9)
Alkaline Phosphatase: 44 [IU]/L (ref 44–121)
Anion Gap: 13 mmol/L (ref 10.0–18.0)
BUN/Creatinine Ratio: 26 — ABNORMAL HIGH (ref 10–24)
BUN: 25 mg/dL (ref 8–27)
Bilirubin Total: 0.4 mg/dL (ref 0.0–1.2)
CO2: 25 mmol/L (ref 20–29)
Calcium: 9.7 mg/dL (ref 8.6–10.2)
Chloride: 103 mmol/L (ref 96–106)
Creatinine, Ser: 0.98 mg/dL (ref 0.76–1.27)
Globulin, Total: 1.9 g/dL (ref 1.5–4.5)
Glucose: 92 mg/dL (ref 70–99)
Potassium: 4.2 mmol/L (ref 3.5–5.2)
Sodium: 141 mmol/L (ref 134–144)
Total Protein: 6.3 g/dL (ref 6.0–8.5)
eGFR: 84 mL/min/{1.73_m2} (ref >59)

## 2023-02-10 LAB — LIPID PANEL
Chol/HDL Ratio: 3 {ratio} (ref 0.0–5.0)
Cholesterol, Total: 136 mg/dL (ref 100–199)
HDL: 46 mg/dL (ref >39)
LDL Chol Calc (NIH): 74 mg/dL (ref 0–99)
Triglycerides: 82 mg/dL (ref 0–149)
VLDL Cholesterol Cal: 16 mg/dL (ref 5–40)

## 2023-02-10 LAB — HEMOGLOBIN A1C
Est. average glucose Bld gHb Est-mCnc: 128 mg/dL
Hgb A1c MFr Bld: 6.1 % — ABNORMAL HIGH (ref 4.8–5.6)

## 2023-02-10 LAB — MICROALBUMIN / CREATININE URINE RATIO
Creatinine, Urine: 168.7 mg/dL
Microalb/Creat Ratio: 14 mg/g{creat} (ref 0–29)
Microalbumin, Urine: 23.2 ug/mL

## 2023-02-10 LAB — TSH: TSH: 1.64 u[IU]/mL (ref 0.450–4.500)

## 2023-02-10 LAB — PSA: Prostate Specific Ag, Serum: 5.7 ng/mL — ABNORMAL HIGH (ref 0.0–4.0)

## 2023-02-12 ENCOUNTER — Ambulatory Visit: Payer: Medicare HMO | Admitting: Internal Medicine

## 2023-02-14 NOTE — Progress Notes (Signed)
Patient called.  Patient aware. His labs ok, he will follow with urologist for elevated PSA.

## 2023-02-22 ENCOUNTER — Other Ambulatory Visit: Payer: Self-pay | Admitting: Internal Medicine

## 2023-02-23 DIAGNOSIS — I10 Essential (primary) hypertension: Secondary | ICD-10-CM | POA: Diagnosis not present

## 2023-03-26 ENCOUNTER — Other Ambulatory Visit: Payer: Self-pay | Admitting: Internal Medicine

## 2023-04-09 ENCOUNTER — Other Ambulatory Visit: Payer: Self-pay | Admitting: Internal Medicine

## 2023-04-09 ENCOUNTER — Other Ambulatory Visit: Payer: Self-pay

## 2023-04-09 MED ORDER — AMLODIPINE BESYLATE 5 MG PO TABS: 5.0000 mg | ORAL_TABLET | Freq: Every day | ORAL | 3 refills | Status: DC

## 2023-04-09 MED ORDER — LOSARTAN POTASSIUM-HCTZ 50-12.5 MG PO TABS: 1.0000 | ORAL_TABLET | Freq: Every day | ORAL | 3 refills | Status: DC

## 2023-04-09 MED ORDER — ALPRAZOLAM 1 MG PO TABS: 1.0000 mg | ORAL_TABLET | Freq: Two times a day (BID) | ORAL | 2 refills | Status: DC | PRN

## 2023-05-07 ENCOUNTER — Ambulatory Visit: Payer: Medicare HMO | Admitting: Internal Medicine

## 2023-05-09 ENCOUNTER — Ambulatory Visit: Admitting: Internal Medicine

## 2023-05-09 ENCOUNTER — Encounter: Payer: Self-pay | Admitting: Internal Medicine

## 2023-05-09 VITALS — BP 170/72 | HR 80 | Temp 97.3°F | Resp 18 | Ht 68.0 in | Wt 186.0 lb

## 2023-05-09 DIAGNOSIS — I10 Essential (primary) hypertension: Secondary | ICD-10-CM

## 2023-05-09 DIAGNOSIS — E039 Hypothyroidism, unspecified: Secondary | ICD-10-CM

## 2023-05-09 DIAGNOSIS — N400 Enlarged prostate without lower urinary tract symptoms: Secondary | ICD-10-CM

## 2023-05-09 DIAGNOSIS — E1169 Type 2 diabetes mellitus with other specified complication: Secondary | ICD-10-CM

## 2023-05-09 DIAGNOSIS — F3178 Bipolar disorder, in full remission, most recent episode mixed: Secondary | ICD-10-CM

## 2023-05-09 DIAGNOSIS — I739 Peripheral vascular disease, unspecified: Secondary | ICD-10-CM

## 2023-05-09 MED ORDER — LOSARTAN POTASSIUM-HCTZ 100-25 MG PO TABS: 1.0000 | ORAL_TABLET | Freq: Every day | ORAL | 6 refills | Status: DC

## 2023-05-09 NOTE — Progress Notes (Unsigned)
 Office Visit  Subjective   Patient ID: Bryan Mccormick   DOB: 17-Apr-1955   Age: 68 y.o.   MRN: [id number]   Chief Complaint Chief Complaint  Patient presents with   Follow-up    3 month follow up     History of Present Illness 68 years old male is here for follow up. He was involved in CCM but lately he has not been checking his blood pressure at home, her blood pressure has been high, someday it was 190/90. He says lately he has headache because of high BP. He says that he is taking care of his sick wife, she is total care and it is killing him. He has no help.    He has hypertension. The patient was enrolled in CCM but he has not received BP apparatus yet. He will be checking his blood pressure at home. The patient's current medications include: AMLODIPINE 5 MG, LOSARTAN/hydrochlorothiazide 50-12.5 mg daily, and potassium chloride ER 20 mEq tablet,extended release(part/cryst). The patient has been tolerating his medications well. The patient denies any chest pain, shortness of breath, orthopnea, and PND. He reports there have been no other symptoms noted.   The patient is a 68 year old Caucasian/White male who has mild diabetes with last HbA1c was 6.1% in 02/09/2023. His last eye examination was 06/2020 and he says that he will make an appointment this year.     The patient's past medical history is: Asthma, Cerebrovascular Accident (CVA), Chronic Obstructive Pulmonary Disease, Hyperlipidemia, Hypertension, and DM. There is no history of foot ulcers and pancreatitis. He still has some weakness in his left side from previous stroke.   The patient is a 68 year old Caucasian/White male who does not have any sign of hypothyroidism or hyperthyroidism. His TSH was 1.6 on 02/09/2023.   Bryan Mccormick returns today for routine followup on his cholesterol. Overall, he states he is doing well and is without any complaints or problems at this time. He specifically denies chest pain, abdominal pain, nausea,  diarrhea, and myalgias. He remains on dietary management as well as the following cholesterol lowering medications ATORVASTATIN 80MG  and FENOFIBRATE 145MG .  His LDL was 74 in 02/09/2023.          Past Medical History No past medical history on file.   Allergies Allergies  Allergen Reactions   Niacin Itching     Review of Systems Review of Systems  Constitutional: Negative.   HENT: Negative.    Respiratory: Negative.    Cardiovascular: Negative.   Gastrointestinal: Negative.   Neurological:  Positive for headaches.       Objective:    Vitals BP (!) 170/72 (BP Location: Left Arm, Patient Position: Sitting)   Pulse 80   Temp (!) 97.3 F (36.3 C)   Resp 18   Ht 5\' 8"  (1.727 m)   Wt 186 lb (84.4 kg)   SpO2 95%   BMI 28.28 kg/m    Physical Examination Physical Exam Constitutional:      Appearance: Normal appearance.  HENT:     Head: Normocephalic and atraumatic.  Cardiovascular:     Rate and Rhythm: Normal rate and regular rhythm.     Heart sounds: Normal heart sounds.  Pulmonary:     Effort: Pulmonary effort is normal.     Breath sounds: Normal breath sounds.  Abdominal:     General: Bowel sounds are normal.     Palpations: Abdomen is soft.  Neurological:     General: No focal  deficit present.     Mental Status: He is alert and oriented to person, place, and time.        Assessment & Plan:   No problem-specific Assessment & Plan notes found for this encounter.    Return in about 3 months (around 08/09/2023) for He will come back i week for BMP as I am increasing the dose of losaratn to 100 mg.   Eloisa Northern, MD

## 2023-05-15 ENCOUNTER — Other Ambulatory Visit: Payer: Self-pay | Admitting: Internal Medicine

## 2023-05-15 DIAGNOSIS — N401 Enlarged prostate with lower urinary tract symptoms: Secondary | ICD-10-CM | POA: Diagnosis not present

## 2023-05-15 DIAGNOSIS — R972 Elevated prostate specific antigen [PSA]: Secondary | ICD-10-CM | POA: Diagnosis not present

## 2023-05-16 ENCOUNTER — Ambulatory Visit: Admitting: Internal Medicine

## 2023-05-16 DIAGNOSIS — R972 Elevated prostate specific antigen [PSA]: Secondary | ICD-10-CM | POA: Diagnosis not present

## 2023-05-16 DIAGNOSIS — N400 Enlarged prostate without lower urinary tract symptoms: Secondary | ICD-10-CM

## 2023-05-16 NOTE — Progress Notes (Signed)
 Nurse Visit Blood Draw- BMP, PSA with free and total

## 2023-05-17 LAB — BASIC METABOLIC PANEL
BUN/Creatinine Ratio: 21 (ref 10–24)
BUN: 22 mg/dL (ref 8–27)
CO2: 27 mmol/L (ref 20–29)
Calcium: 10.6 mg/dL — ABNORMAL HIGH (ref 8.6–10.2)
Chloride: 96 mmol/L (ref 96–106)
Creatinine, Ser: 1.06 mg/dL (ref 0.76–1.27)
Glucose: 124 mg/dL — ABNORMAL HIGH (ref 70–99)
Potassium: 4.5 mmol/L (ref 3.5–5.2)
Sodium: 138 mmol/L (ref 134–144)
eGFR: 76 mL/min/{1.73_m2} (ref >59)

## 2023-05-17 LAB — PSA, TOTAL AND FREE
PSA, Free Pct: 23.2 %
PSA, Free: 0.79 ng/mL
Prostate Specific Ag, Serum: 3.4 ng/mL (ref 0.0–4.0)

## 2023-05-22 NOTE — Progress Notes (Signed)
 Patient called.  Patient aware. Labs including PSA are good.

## 2023-06-02 DIAGNOSIS — I10 Essential (primary) hypertension: Secondary | ICD-10-CM | POA: Diagnosis not present

## 2023-07-02 ENCOUNTER — Other Ambulatory Visit: Payer: Self-pay | Admitting: Internal Medicine

## 2023-07-02 DIAGNOSIS — I10 Essential (primary) hypertension: Secondary | ICD-10-CM

## 2023-07-04 ENCOUNTER — Other Ambulatory Visit: Payer: Self-pay | Admitting: Internal Medicine

## 2023-07-04 MED ORDER — ALPRAZOLAM 1 MG PO TABS: 1.0000 mg | ORAL_TABLET | Freq: Two times a day (BID) | ORAL | 2 refills | Status: DC | PRN

## 2023-07-09 ENCOUNTER — Other Ambulatory Visit: Payer: Self-pay | Admitting: Internal Medicine

## 2023-07-09 MED ORDER — METFORMIN HCL ER 500 MG PO TB24: 500.0000 mg | ORAL_TABLET | Freq: Two times a day (BID) | ORAL | 0 refills | Status: DC

## 2023-07-30 ENCOUNTER — Other Ambulatory Visit: Payer: Self-pay | Admitting: Internal Medicine

## 2023-08-01 ENCOUNTER — Other Ambulatory Visit: Payer: Self-pay | Admitting: Internal Medicine

## 2023-08-06 ENCOUNTER — Ambulatory Visit: Admitting: Internal Medicine

## 2023-08-15 ENCOUNTER — Encounter: Payer: Self-pay | Admitting: Internal Medicine

## 2023-08-15 ENCOUNTER — Ambulatory Visit: Admitting: Internal Medicine

## 2023-08-15 VITALS — BP 134/82 | HR 67 | Temp 97.8°F | Resp 18 | Ht 68.0 in | Wt 184.0 lb

## 2023-08-15 DIAGNOSIS — I739 Peripheral vascular disease, unspecified: Secondary | ICD-10-CM

## 2023-08-15 DIAGNOSIS — E785 Hyperlipidemia, unspecified: Secondary | ICD-10-CM | POA: Diagnosis not present

## 2023-08-15 DIAGNOSIS — E1169 Type 2 diabetes mellitus with other specified complication: Secondary | ICD-10-CM | POA: Diagnosis not present

## 2023-08-15 DIAGNOSIS — F3178 Bipolar disorder, in full remission, most recent episode mixed: Secondary | ICD-10-CM

## 2023-08-15 DIAGNOSIS — E039 Hypothyroidism, unspecified: Secondary | ICD-10-CM | POA: Diagnosis not present

## 2023-08-15 DIAGNOSIS — M545 Low back pain, unspecified: Secondary | ICD-10-CM | POA: Diagnosis not present

## 2023-08-15 DIAGNOSIS — I10 Essential (primary) hypertension: Secondary | ICD-10-CM | POA: Diagnosis not present

## 2023-08-15 MED ORDER — ALPRAZOLAM 1 MG PO TABS: 1.0000 mg | ORAL_TABLET | Freq: Two times a day (BID) | ORAL | 2 refills | Status: DC | PRN

## 2023-08-15 MED ORDER — CELECOXIB 200 MG PO CAPS: 200.0000 mg | ORAL_CAPSULE | Freq: Every day | ORAL | 2 refills | Status: AC

## 2023-08-15 NOTE — Progress Notes (Signed)
 Office Visit  Subjective   Patient ID: Bryan Mccormick   DOB: October 01, 1955   Age: 68 y.o.   MRN: [id number]   Chief Complaint Chief Complaint  Patient presents with   Follow-up    Follow up     History of Present Illness 68 years old male is here for follow up. He  Missed his last appointment because his wife is very sick and is under hospice care and he has been overwhelmed because of that.  He also says that he fell from stay at [facility] few months ago and he still hurt in his back and hips.  But he is too busy because of his wife's illness.    He has hypertension. The patient was enrolled in CCM but he has not received BP apparatus yet. He will be checking his blood pressure at home. The patient's current medications include: AMLODIPINE  5 MG, LOSARTAN /hydrochlorothiazide 50-12.5 mg daily, and potassium chloride  ER 20 mEq tablet,extended release(part/cryst). The patient has been tolerating his medications well. The patient denies any chest pain, shortness of breath, orthopnea, and PND. He reports there have been no other symptoms noted.   The patient is a 68 year old Caucasian/White male who has mild diabetes with last HbA1c was 6.1% in 02/09/2023. His last eye examination was 06/2020 and he says that he will make an appointment this year.     The patient's past medical history is: Asthma, Cerebrovascular Accident (CVA), Chronic Obstructive Pulmonary Disease, Hyperlipidemia, Hypertension, and DM. There is no history of foot ulcers and pancreatitis. He still has some weakness in his left side from previous stroke.   The patient is a 68 year old Caucasian/White male who does not have any sign of hypothyroidism or hyperthyroidism. His TSH was 1.6 on 02/09/2023.   Bryan Fogo. Mccormick returns today for routine followup on his cholesterol. Overall, he states he is doing well and is without any complaints or problems at this time. He specifically denies chest pain, abdominal pain, nausea, diarrhea, and myalgias.  He remains on dietary management as well as the following cholesterol lowering medications ATORVASTATIN 80MG  and FENOFIBRATE  145MG .  His LDL was 74 in 02/09/2023.   Past Medical History No past medical history on file.   Allergies Allergies  Allergen Reactions   Niacin Itching     Review of Systems Review of Systems  Musculoskeletal:  Positive for back pain and joint pain.       Objective:    Vitals BP 134/82   Pulse 67   Temp 97.8 F (36.6 C)   Resp 18   Ht 5' 8 (1.727 m)   Wt 184 lb (83.5 kg)   SpO2 98%   BMI 27.98 kg/m    Physical Examination Physical Exam Constitutional:      Appearance: Normal appearance.   Cardiovascular:     Rate and Rhythm: Normal rate and regular rhythm.     Heart sounds: Normal heart sounds.  Pulmonary:     Effort: Pulmonary effort is normal.     Breath sounds: Normal breath sounds.  Abdominal:     Palpations: Abdomen is soft.   Neurological:     General: No focal deficit present.     Mental Status: He is alert and oriented to person, place, and time.        Assessment & Plan:   Hypertension   His blood pressure is better and will continue to monitor.  Hypothyroidism (acquired)    He takes levothyroxine  75 mcg daily.  His TSH was normal.  Dyslipidemia associated with type 2 diabetes mellitus (HCC)   His lipid and hemoglobin has been stable.  Bipolar disorder (manic depression) (HCC)   He has a bipolar disorder and is under lot of stress because of his wife illness.  I will send refill of alprazolam  1 mg twice a day p.r.n.Aaron Aas    Return in about 3 months (around 11/15/2023).   Tita Form, MD

## 2023-08-15 NOTE — Assessment & Plan Note (Signed)
 He takes levothyroxine  75 mcg daily.  His TSH was normal.

## 2023-08-15 NOTE — Assessment & Plan Note (Signed)
 From fall.  I will start Celebrex 200 mg daily for 3 weeks and then as needed.

## 2023-08-15 NOTE — Assessment & Plan Note (Signed)
 His lipid and hemoglobin has been stable.

## 2023-08-15 NOTE — Assessment & Plan Note (Signed)
 His blood pressure is better and will continue to monitor.

## 2023-08-15 NOTE — Assessment & Plan Note (Signed)
 He has a bipolar disorder and is under lot of stress because of his wife illness.  I will send refill of alprazolam  1 mg twice a day p.r.n..

## 2023-09-05 DIAGNOSIS — E119 Type 2 diabetes mellitus without complications: Secondary | ICD-10-CM | POA: Diagnosis not present

## 2023-09-05 DIAGNOSIS — H52223 Regular astigmatism, bilateral: Secondary | ICD-10-CM | POA: Diagnosis not present

## 2023-09-17 ENCOUNTER — Other Ambulatory Visit: Payer: Self-pay | Admitting: Internal Medicine

## 2023-09-17 MED ORDER — METFORMIN HCL ER 500 MG PO TB24: 500.0000 mg | ORAL_TABLET | Freq: Two times a day (BID) | ORAL | 1 refills | Status: DC

## 2023-09-19 ENCOUNTER — Encounter: Payer: Self-pay | Admitting: Internal Medicine

## 2023-09-19 ENCOUNTER — Ambulatory Visit: Admitting: Internal Medicine

## 2023-09-19 VITALS — BP 130/80 | HR 69 | Temp 97.3°F | Resp 18 | Ht 68.0 in | Wt 197.2 lb

## 2023-09-19 DIAGNOSIS — R350 Frequency of micturition: Secondary | ICD-10-CM | POA: Insufficient documentation

## 2023-09-19 DIAGNOSIS — R300 Dysuria: Secondary | ICD-10-CM | POA: Insufficient documentation

## 2023-09-19 DIAGNOSIS — Z113 Encounter for screening for infections with a predominantly sexual mode of transmission: Secondary | ICD-10-CM | POA: Diagnosis not present

## 2023-09-19 LAB — POCT URINALYSIS DIPSTICK
Bilirubin, UA: NEGATIVE
Blood, UA: NEGATIVE
Glucose, UA: NEGATIVE
Ketones, UA: NEGATIVE
Leukocytes, UA: NEGATIVE
Nitrite, UA: NEGATIVE
Protein, UA: NEGATIVE
Spec Grav, UA: 1.015 (ref 1.010–1.025)
Urobilinogen, UA: 0.2 U/dL
pH, UA: 5.5 (ref 5.0–8.0)

## 2023-09-19 NOTE — Assessment & Plan Note (Signed)
 I will send his urine for GC and chlamydia.

## 2023-09-19 NOTE — Progress Notes (Signed)
   Acute Office Visit  Subjective:     Patient ID: Bryan Mccormick, male    DOB: 07-09-1955, 68 y.o.   MRN: [id number]  Chief Complaint  Patient presents with   office visit    Patient here for burning sensation when urinating X 1 week     HPI Patient is in today for burning urine for few days. She says that she has been seeing a male who has urinary infection. He denies any discharge. No fever or chills.   Review of Systems  Constitutional: Negative.   Cardiovascular: Negative.   Genitourinary:  Positive for dysuria and frequency.        Objective:    BP 130/80   Pulse 69   Temp (!) 97.3 F (36.3 C)   Resp 18   Ht 5' 8 (1.727 m)   Wt 197 lb 4 oz (89.5 kg)   SpO2 96%   BMI 29.99 kg/m    Physical Exam Constitutional:      Appearance: Normal appearance.  Cardiovascular:     Rate and Rhythm: Normal rate and regular rhythm.     Heart sounds: Normal heart sounds.  Pulmonary:     Effort: Pulmonary effort is normal.     Breath sounds: Normal breath sounds.  Abdominal:     General: Bowel sounds are normal.     Palpations: Abdomen is soft.  Neurological:     General: No focal deficit present.     Mental Status: He is alert.     Results for orders placed or performed in visit on 09/19/23  POCT urinalysis dipstick  Result Value Ref Range   Color, UA lite yellow    Clarity, UA clear    Glucose, UA Negative Negative   Bilirubin, UA neg    Ketones, UA neg    Spec Grav, UA 1.015 1.010 - 1.025   Blood, UA neg    pH, UA 5.5 5.0 - 8.0   Protein, UA Negative Negative   Urobilinogen, UA 0.2 0.2 or 1.0 E.U./dL   Nitrite, UA neg    Leukocytes, UA Negative Negative   Appearance clear    Odor none         Assessment & Plan:   Problem List Items Addressed This Visit       Other   Dysuria - Primary     I will send his urine for GC and chlamydia.      Relevant Orders   GC/Chlamydia Probe Amp([facility])   Urine frequency   Relevant Orders   POCT urinalysis  dipstick (Completed)    No orders of the defined types were placed in this encounter.   No follow-ups on file.  Roetta Dare, MD

## 2023-09-21 LAB — GC/CHLAMYDIA PROBE AMP
Chlamydia trachomatis, NAA: NEGATIVE
Neisseria Gonorrhoeae by PCR: NEGATIVE

## 2023-10-31 DIAGNOSIS — I10 Essential (primary) hypertension: Secondary | ICD-10-CM | POA: Diagnosis not present

## 2023-11-01 ENCOUNTER — Other Ambulatory Visit: Payer: Self-pay | Admitting: Internal Medicine

## 2023-11-14 ENCOUNTER — Encounter: Payer: Self-pay | Admitting: Internal Medicine

## 2023-11-14 ENCOUNTER — Other Ambulatory Visit: Payer: Self-pay | Admitting: Urology

## 2023-11-14 ENCOUNTER — Ambulatory Visit: Admitting: Internal Medicine

## 2023-11-14 VITALS — BP 132/82 | HR 63 | Temp 97.8°F | Resp 18 | Ht 68.0 in | Wt 200.5 lb

## 2023-11-14 DIAGNOSIS — I10 Essential (primary) hypertension: Secondary | ICD-10-CM

## 2023-11-14 DIAGNOSIS — F3178 Bipolar disorder, in full remission, most recent episode mixed: Secondary | ICD-10-CM

## 2023-11-14 DIAGNOSIS — E1169 Type 2 diabetes mellitus with other specified complication: Secondary | ICD-10-CM | POA: Diagnosis not present

## 2023-11-14 DIAGNOSIS — J449 Chronic obstructive pulmonary disease, unspecified: Secondary | ICD-10-CM | POA: Diagnosis not present

## 2023-11-14 DIAGNOSIS — E785 Hyperlipidemia, unspecified: Secondary | ICD-10-CM | POA: Diagnosis not present

## 2023-11-14 DIAGNOSIS — E039 Hypothyroidism, unspecified: Secondary | ICD-10-CM | POA: Diagnosis not present

## 2023-11-14 DIAGNOSIS — R972 Elevated prostate specific antigen [PSA]: Secondary | ICD-10-CM

## 2023-11-14 MED ORDER — ALPRAZOLAM 1 MG PO TABS: 1.0000 mg | ORAL_TABLET | Freq: Two times a day (BID) | ORAL | 2 refills | Status: DC | PRN

## 2023-11-14 NOTE — Progress Notes (Signed)
 Office Visit  Subjective   Patient ID: Bryan Mccormick   DOB: 11/24/1955   Age: 68 y.o.   MRN: [id number]   Chief Complaint Chief Complaint  Patient presents with   Follow-up    3 month follow up     History of Present Illness 68 years old male is here for follow up. He saw Dr. Marda who did PSA and he ordered MRI of prostate and waiting for approval from insurance.    He is depress since his wife died. He does not have suicidal ideation. He has bipolar disorder and he take divalproex  250 twice a day and he take alprazolam  as needed.   He has hypertension. The patient was enrolled in CCM but he has not received BP apparatus yet. He will be checking his blood pressure at home. The patient's current medications include: AMLODIPINE  5 MG, LOSARTAN /hydrochlorothiazide 50-12.5 mg daily, and potassium chloride  ER 20 mEq tablet,extended release(part/cryst). The patient has been tolerating his medications well. The patient denies any chest pain, shortness of breath, orthopnea, and PND. He reports there have been no other symptoms noted.   The patient is a 68 year old Caucasian/White male who has mild diabetes with last HbA1c was 6.1% in 02/09/2023.   He need to see eye doctor.  We will do labs on next visit.  The patient's past medical history is: Asthma, Cerebrovascular Accident (CVA), Chronic Obstructive Pulmonary Disease, Hyperlipidemia, Hypertension, and DM. There is no history of foot ulcers and pancreatitis. He still has some weakness in his left side from previous stroke.   The patient is a 68 year old Caucasian/White male who does not have any sign of hypothyroidism or hyperthyroidism. His TSH was 1.6 on 02/09/2023.   Sim BRAVO. Bryan Mccormick returns today for routine followup on his cholesterol. Overall, he states he is doing well and is without any complaints or problems at this time. He specifically denies chest pain, abdominal pain, nausea, diarrhea, and myalgias. He remains on dietary management as  well as the following cholesterol lowering medications ATORVASTATIN 80MG  and FENOFIBRATE  145MG .  His LDL was 74 in 02/09/2023.   Past Medical History No past medical history on file.   Allergies Allergies  Allergen Reactions   Niacin Itching     Review of Systems Review of Systems  Constitutional: Negative.   HENT: Negative.    Respiratory: Negative.    Cardiovascular: Negative.   Gastrointestinal: Negative.   Neurological: Negative.   Psychiatric/Behavioral:  The patient has insomnia.        Objective:    Vitals BP 132/82   Pulse 63   Temp 97.8 F (36.6 C)   Resp 18   Ht 5' 8 (1.727 m)   Wt 200 lb 8 oz (90.9 kg)   SpO2 97%   BMI 30.49 kg/m    Physical Examination Physical Exam Constitutional:      Appearance: Normal appearance.  HENT:     Head: Normocephalic and atraumatic.  Eyes:     Extraocular Movements: Extraocular movements intact.     Pupils: Pupils are equal, round, and reactive to light.  Cardiovascular:     Rate and Rhythm: Normal rate and regular rhythm.     Heart sounds: Normal heart sounds.  Pulmonary:     Effort: Pulmonary effort is normal.     Breath sounds: Normal breath sounds.  Abdominal:     General: Bowel sounds are normal.     Palpations: Abdomen is soft.  Neurological:  General: No focal deficit present.     Mental Status: He is alert and oriented to person, place, and time.        Assessment & Plan:   Hypertension   His blood pressure is controlled.  COPD (chronic obstructive pulmonary disease) (HCC)   Stable.  Hypothyroidism (acquired)   His TSH was normal on levothyroxine  75 mcg daily.  I will repeat TSH level on next visit.  Dyslipidemia associated with type 2 diabetes mellitus (HCC)   Was controlled and I will repeat labs on next visit.  He need to see eye doctor for diabetic eye exam.  Bipolar disorder (manic depression) (HCC)   He take Depakote  250 mg twice a day and alprazolam  as needed.  He says that he  try to cut it on half tablet.    Return in about 3 months (around 02/13/2024) for For AWE on next visit.   Roetta Dare, MD

## 2023-11-14 NOTE — Assessment & Plan Note (Signed)
 His blood pressure is controlled.

## 2023-11-14 NOTE — Assessment & Plan Note (Signed)
 He take Depakote  250 mg twice a day and alprazolam  as needed.  He says that he try to cut it on half tablet.

## 2023-11-14 NOTE — Assessment & Plan Note (Signed)
 Stable

## 2023-11-14 NOTE — Assessment & Plan Note (Signed)
 His TSH was normal on levothyroxine  75 mcg daily.  I will repeat TSH level on next visit.

## 2023-11-14 NOTE — Assessment & Plan Note (Signed)
 Was controlled and I will repeat labs on next visit.  He need to see eye doctor for diabetic eye exam.

## 2023-11-15 ENCOUNTER — Other Ambulatory Visit: Payer: Self-pay | Admitting: Internal Medicine

## 2023-11-19 ENCOUNTER — Ambulatory Visit: Admitting: Internal Medicine

## 2023-11-19 DIAGNOSIS — Z23 Encounter for immunization: Secondary | ICD-10-CM

## 2023-11-20 NOTE — Progress Notes (Signed)
   Established Patient Office Visit  Subjective   Patient ID: Bryan Mccormick, male    DOB: 12/18/1955  Age: 68 y.o. MRN: [id number]  Chief Complaint  Patient presents with   Flu Vaccine    Flu vac    HPI    ROS    Objective:     There were no vitals taken for this visit.   Physical Exam   No results found for any visits on 11/19/23.    The ASCVD Risk score (Arnett DK, et al., 2019) failed to calculate for the following reasons:   Risk score cannot be calculated because patient has a medical history suggesting prior/existing ASCVD    Assessment & Plan:   Problem List Items Addressed This Visit   None Visit Diagnoses       Flu vaccine need    -  Primary   Relevant Orders   Influenza, MDCK, trivalent, PF(Flucelvax egg-free) (Completed)       No follow-ups on file.    Macel Yearsley

## 2023-12-03 ENCOUNTER — Ambulatory Visit: Admitting: Internal Medicine

## 2023-12-03 ENCOUNTER — Encounter: Payer: Self-pay | Admitting: Internal Medicine

## 2023-12-03 VITALS — BP 134/82 | HR 75 | Temp 97.7°F | Resp 18 | Wt 207.0 lb

## 2023-12-03 DIAGNOSIS — R052 Subacute cough: Secondary | ICD-10-CM

## 2023-12-03 DIAGNOSIS — G8929 Other chronic pain: Secondary | ICD-10-CM | POA: Diagnosis not present

## 2023-12-03 DIAGNOSIS — M545 Low back pain, unspecified: Secondary | ICD-10-CM | POA: Diagnosis not present

## 2023-12-03 DIAGNOSIS — R059 Cough, unspecified: Secondary | ICD-10-CM | POA: Diagnosis not present

## 2023-12-03 LAB — POCT URINALYSIS DIPSTICK
Bilirubin, UA: NEGATIVE
Blood, UA: NEGATIVE
Glucose, UA: NEGATIVE
Ketones, UA: NEGATIVE
Leukocytes, UA: NEGATIVE
Nitrite, UA: NEGATIVE
Protein, UA: NEGATIVE
Spec Grav, UA: 1.01 (ref 1.010–1.025)
Urobilinogen, UA: 0.2 U/dL
pH, UA: 5.5 (ref 5.0–8.0)

## 2023-12-03 MED ORDER — CLOTRIMAZOLE-BETAMETHASONE 1-0.05 % EX CREA: TOPICAL_CREAM | Freq: Two times a day (BID) | CUTANEOUS | 3 refills | Status: AC

## 2023-12-03 MED ORDER — PREDNISONE 10 MG PO TABS: 40.0000 mg | ORAL_TABLET | Freq: Every day | ORAL | 0 refills | Status: DC

## 2023-12-03 MED ORDER — ATORVASTATIN CALCIUM 80 MG PO TABS: 80.0000 mg | ORAL_TABLET | Freq: Every day | ORAL | 0 refills | Status: DC

## 2023-12-03 MED ORDER — AZITHROMYCIN 250 MG PO TABS: ORAL_TABLET | ORAL | 0 refills | Status: AC

## 2023-12-03 NOTE — Progress Notes (Addendum)
   Office Visit  Subjective   Patient ID: Bryan Mccormick   DOB: 1955/03/25   Age: 68 y.o.   MRN: [id number]   Chief Complaint Chief Complaint  Patient presents with   office visit    Left lower back pain      History of Present Illness 68 years old male is here c/o pain in his left flank area for long time. Pain get worse when he lye down and he could not sleep last night. He take 4 exederine for pain that helps Pain is non radiating. He describe is worse now.   He also has cough with black sputum and pain get worse when he cough. No fever or chills. No SOB.   Past Medical History No past medical history on file.   Allergies Allergies  Allergen Reactions   Niacin Itching     Review of Systems Review of Systems  Respiratory:  Positive for cough.   Musculoskeletal:  Positive for back pain.       Objective:    Vitals BP 134/82   Pulse 75   Temp 97.7 F (36.5 C)   Resp 18   Wt 207 lb (93.9 kg)   SpO2 95%   BMI 31.47 kg/m    Physical Examination Physical Exam Constitutional:      Appearance: Normal appearance.  Pulmonary:     Effort: Pulmonary effort is normal.     Breath sounds: Normal breath sounds.  Abdominal:     Palpations: Abdomen is soft.  Musculoskeletal:        General: Tenderness present.     Comments:   Mild tenderness along para spinal musculature in right flank.  Neurological:     General: No focal deficit present.     Mental Status: He is alert and oriented to person, place, and time.        Assessment & Plan:   Cough   I will start Z-Pak and do chest x-ray.  Chronic midline low back pain without sciatica   I will do x-ray of lumbar spine and give him prednisone 40 mg daily for 5 days and stop.  His urine analysis is negative.    No follow-ups on file.   Roetta Dare, MD

## 2023-12-03 NOTE — Assessment & Plan Note (Signed)
 I will do x-ray of lumbar spine and give him prednisone 40 mg daily for 5 days and stop.  His urine analysis is negative.

## 2023-12-03 NOTE — Assessment & Plan Note (Signed)
 I will start Z-Pak and do chest x-ray.

## 2023-12-04 DIAGNOSIS — R109 Unspecified abdominal pain: Secondary | ICD-10-CM | POA: Diagnosis not present

## 2023-12-04 DIAGNOSIS — R059 Cough, unspecified: Secondary | ICD-10-CM | POA: Diagnosis not present

## 2023-12-07 ENCOUNTER — Encounter: Payer: Self-pay | Admitting: Internal Medicine

## 2023-12-13 ENCOUNTER — Other Ambulatory Visit: Payer: Self-pay

## 2023-12-14 ENCOUNTER — Encounter: Payer: Self-pay | Admitting: Internal Medicine

## 2023-12-14 ENCOUNTER — Ambulatory Visit: Admitting: Internal Medicine

## 2023-12-14 VITALS — BP 118/60 | HR 73 | Temp 97.7°F | Resp 18 | Ht 68.0 in | Wt 203.4 lb

## 2023-12-14 DIAGNOSIS — R5383 Other fatigue: Secondary | ICD-10-CM | POA: Diagnosis not present

## 2023-12-14 DIAGNOSIS — M79604 Pain in right leg: Secondary | ICD-10-CM

## 2023-12-14 DIAGNOSIS — E785 Hyperlipidemia, unspecified: Secondary | ICD-10-CM | POA: Diagnosis not present

## 2023-12-14 DIAGNOSIS — E039 Hypothyroidism, unspecified: Secondary | ICD-10-CM

## 2023-12-14 DIAGNOSIS — I739 Peripheral vascular disease, unspecified: Secondary | ICD-10-CM

## 2023-12-14 DIAGNOSIS — M79605 Pain in left leg: Secondary | ICD-10-CM

## 2023-12-14 DIAGNOSIS — E1169 Type 2 diabetes mellitus with other specified complication: Secondary | ICD-10-CM

## 2023-12-14 MED ORDER — METHOCARBAMOL 750 MG PO TABS: 750.0000 mg | ORAL_TABLET | Freq: Three times a day (TID) | ORAL | 1 refills | Status: AC | PRN

## 2023-12-14 NOTE — Assessment & Plan Note (Signed)
I will do hemoglobin A1c today.

## 2023-12-14 NOTE — Assessment & Plan Note (Signed)
 He takes levothyroxine  75 mcg daily and I will do TSH level.

## 2023-12-14 NOTE — Assessment & Plan Note (Deleted)
 I will do ABI

## 2023-12-14 NOTE — Assessment & Plan Note (Signed)
 I will refer him to see vascular surgeon as I wonder if he has claudication when he walks he feels heaviness in his leg.

## 2023-12-14 NOTE — Assessment & Plan Note (Signed)
I will do CBC

## 2023-12-14 NOTE — Progress Notes (Signed)
 Office Visit  Subjective   Patient ID: Bryan Mccormick   DOB: May 04, 1955   Age: 68 y.o.   MRN: [id number]   Chief Complaint Chief Complaint  Patient presents with   Back Pain     History of Present Illness   68 years old male who is here for follow-up.  He was complaining of back pain and I have given him course of prednisone.  He says that that did help but after he finished taking prednisone his back pain came back.  He says that pain in his lower back and he cannot do anything because every time he tried to walk he has hips and legs become so heavy that he does give up and go to bed.  He has a history of peripheral arterial disease and he was seen by vascular surgeon few years ago.  I have suggested that he need to go to see vascular surgeon to make sure that circulation is okay in his leg.    He also complaining of staying tired and feels like he has no energy.  He has hypothyroidism and he take levothyroxine  and last TSH was done in December last year.SABRA    He also has type 2 diabetes mellitus and his last he 0 globin A1c was  6.1 on February 09, 2023. He takes metformin  500 mg twice a day.    He says that he was seen by urologist and he has referred him to [facility] for MRI because they noted a spot in his bladder.    He has COPD and he has a cough with greenish phlegm and I have given him antibiotic that has resolved.  He still get short of breath.  Past Medical History History reviewed. No pertinent past medical history.   Allergies Allergies  Allergen Reactions   Niacin Itching     Review of Systems Review of Systems  Constitutional:  Positive for malaise/fatigue.  Respiratory:  Positive for shortness of breath.   Gastrointestinal: Negative.   Musculoskeletal:  Positive for back pain.       Objective:    Vitals BP 118/60 (BP Location: Left Arm, Patient Position: Sitting, Cuff Size: Normal)   Pulse 73   Temp 97.7 F (36.5 C)   Resp 18   Ht 5' 8 (1.727 m)   Wt  203 lb 6 oz (92.3 kg)   SpO2 97%   BMI 30.92 kg/m    Physical Examination Physical Exam Constitutional:      Appearance: Normal appearance. He is obese.  HENT:     Head: Normocephalic and atraumatic.  Cardiovascular:     Rate and Rhythm: Normal rate and regular rhythm.     Heart sounds: Normal heart sounds.  Pulmonary:     Effort: Pulmonary effort is normal.     Breath sounds: Normal breath sounds.  Abdominal:     General: Bowel sounds are normal.     Palpations: Abdomen is soft.  Neurological:     General: No focal deficit present.     Mental Status: He is alert and oriented to person, place, and time.        Assessment & Plan:   PAD (peripheral artery disease)   I will refer him to see vascular surgeon as I wonder if he has claudication when he walks he feels heaviness in his leg.  Hypothyroidism (acquired)   He takes levothyroxine  75 mcg daily and I will do TSH level.  Dyslipidemia associated with type 2 diabetes mellitus (  HCC)   I will do hemoglobin A1c today.  Fatigue   I will do CBC.    Return in about 1 month (around 01/14/2024).   Roetta Dare, MD

## 2023-12-15 LAB — CBC WITH DIFFERENTIAL/PLATELET
Basophils Absolute: 0.1 x10E3/uL (ref 0.0–0.2)
Basos: 1 %
EOS (ABSOLUTE): 0 x10E3/uL (ref 0.0–0.4)
Eos: 0 %
Hematocrit: 41.9 % (ref 37.5–51.0)
Hemoglobin: 13.8 g/dL (ref 13.0–17.7)
Immature Grans (Abs): 0.3 x10E3/uL — ABNORMAL HIGH (ref 0.0–0.1)
Immature Granulocytes: 3 %
Lymphocytes Absolute: 1.3 x10E3/uL (ref 0.7–3.1)
Lymphs: 16 %
MCH: 31.2 pg (ref 26.6–33.0)
MCHC: 32.9 g/dL (ref 31.5–35.7)
MCV: 95 fL (ref 79–97)
Monocytes Absolute: 0.9 x10E3/uL (ref 0.1–0.9)
Monocytes: 11 %
Neutrophils Absolute: 5.4 x10E3/uL (ref 1.4–7.0)
Neutrophils: 69 %
Platelets: 263 x10E3/uL (ref 150–450)
RBC: 4.42 x10E6/uL (ref 4.14–5.80)
RDW: 12.6 % (ref 11.6–15.4)
WBC: 8 x10E3/uL (ref 3.4–10.8)

## 2023-12-15 LAB — HEMOGLOBIN A1C
Est. average glucose Bld gHb Est-mCnc: 157 mg/dL
Hgb A1c MFr Bld: 7.1 % — ABNORMAL HIGH (ref 4.8–5.6)

## 2023-12-15 LAB — TSH: TSH: 2.48 u[IU]/mL (ref 0.450–4.500)

## 2023-12-15 LAB — CMP14 + ANION GAP
ALT: 26 IU/L (ref 0–44)
AST: 26 IU/L (ref 0–40)
Albumin: 4.4 g/dL (ref 3.9–4.9)
Alkaline Phosphatase: 46 IU/L — ABNORMAL LOW (ref 47–123)
Anion Gap: 19 mmol/L — ABNORMAL HIGH (ref 10.0–18.0)
BUN/Creatinine Ratio: 22 (ref 10–24)
BUN: 29 mg/dL — ABNORMAL HIGH (ref 8–27)
Bilirubin Total: 0.4 mg/dL (ref 0.0–1.2)
CO2: 23 mmol/L (ref 20–29)
Calcium: 9.3 mg/dL (ref 8.6–10.2)
Chloride: 97 mmol/L (ref 96–106)
Creatinine, Ser: 1.32 mg/dL — ABNORMAL HIGH (ref 0.76–1.27)
Globulin, Total: 2 g/dL (ref 1.5–4.5)
Glucose: 167 mg/dL — ABNORMAL HIGH (ref 70–99)
Potassium: 4.3 mmol/L (ref 3.5–5.2)
Sodium: 139 mmol/L (ref 134–144)
Total Protein: 6.4 g/dL (ref 6.0–8.5)
eGFR: 58 mL/min/1.73 — ABNORMAL LOW (ref >59)

## 2023-12-24 ENCOUNTER — Other Ambulatory Visit: Payer: Self-pay

## 2023-12-24 ENCOUNTER — Ambulatory Visit: Payer: Self-pay

## 2023-12-24 DIAGNOSIS — I739 Peripheral vascular disease, unspecified: Secondary | ICD-10-CM

## 2023-12-24 NOTE — Progress Notes (Signed)
 Patient called.  Patient aware.

## 2024-01-14 ENCOUNTER — Encounter: Payer: Self-pay | Admitting: Internal Medicine

## 2024-01-14 ENCOUNTER — Ambulatory Visit: Admitting: Internal Medicine

## 2024-01-14 VITALS — BP 124/80 | HR 72 | Temp 97.8°F | Resp 18 | Ht 67.5 in | Wt 206.0 lb

## 2024-01-14 DIAGNOSIS — I10 Essential (primary) hypertension: Secondary | ICD-10-CM | POA: Diagnosis not present

## 2024-01-14 DIAGNOSIS — E1169 Type 2 diabetes mellitus with other specified complication: Secondary | ICD-10-CM

## 2024-01-14 DIAGNOSIS — I739 Peripheral vascular disease, unspecified: Secondary | ICD-10-CM

## 2024-01-14 DIAGNOSIS — E785 Hyperlipidemia, unspecified: Secondary | ICD-10-CM

## 2024-01-14 NOTE — Assessment & Plan Note (Signed)
 His Hb A1c was 7.1 last month and his

## 2024-01-14 NOTE — Progress Notes (Signed)
 Office Visit  Subjective   Patient ID: Bryan Mccormick   DOB: 04-24-1955   Age: 68 y.o.   MRN: [id number]   Chief Complaint Chief Complaint  Patient presents with   Follow-up    1 month follow up     History of Present Illness 68 years old male is here for follow up. He is depress since his wife died. He does not have suicidal ideation. He has bipolar disorder and he take divalproex  250 twice a day and he take alprazolam  as needed.    He has hypertension. The patient was enrolled in CCM but he has not received BP apparatus yet. He will be checking his blood pressure at home. The patient's current medications include: AMLODIPINE  5 MG, LOSARTAN /hydrochlorothiazide 50-12.5 mg daily, and potassium chloride  ER 20 mEq tablet,extended release(part/cryst). The patient has been tolerating his medications well. The patient denies any chest pain, shortness of breath, orthopnea, and PND. He reports there have been no other symptoms noted.   The patient is a 68 year old Caucasian/White male who has mild diabetes with last HbA1c was 6.1% in 02/09/2023.   He need to see eye doctor.  We will do labs on next visit.   The patient's past medical history is: Asthma, Cerebrovascular Accident (CVA), Chronic Obstructive Pulmonary Disease, Hyperlipidemia, Hypertension, and DM. There is no history of foot ulcers and pancreatitis. He still has some weakness in his left side from previous stroke.   The patient is a 68 year old Caucasian/White male who does not have any sign of hypothyroidism or hyperthyroidism. His TSH was 1.6 on 02/09/2023.   Sim BRAVO. Goynes returns today for routine followup on his cholesterol. Overall, he states he is doing well and is without any complaints or problems at this time. He specifically denies chest pain, abdominal pain, nausea, diarrhea, and myalgias. He remains on dietary management as well as the following cholesterol lowering medications ATORVASTATIN 80MG  and FENOFIBRATE  145MG .  His LDL  was 74 in 02/09/2023.   He saw Dr. Marda who did PSA and he ordered MRI of prostate and waiting for approval from insurance.   Past Medical History No past medical history on file.   Allergies Allergies  Allergen Reactions   Niacin Itching     Review of Systems Review of Systems  Constitutional: Negative.   Respiratory: Negative.    Cardiovascular: Negative.   Musculoskeletal:        Leg pain when he walk  Neurological:  Positive for weakness.       Objective:    Vitals BP 124/80   Pulse 72   Temp 97.8 F (36.6 C)   Resp 18   Ht 5' 7.5 (1.715 m)   Wt 206 lb (93.4 kg)   SpO2 98%   BMI 31.79 kg/m    Physical Examination Physical Exam Constitutional:      Appearance: Normal appearance.  HENT:     Head: Normocephalic and atraumatic.  Eyes:     Extraocular Movements: Extraocular movements intact.     Pupils: Pupils are equal, round, and reactive to light.  Cardiovascular:     Rate and Rhythm: Normal rate and regular rhythm.     Heart sounds: Normal heart sounds.  Pulmonary:     Effort: Pulmonary effort is normal.     Breath sounds: Normal breath sounds.  Abdominal:     Palpations: Abdomen is soft.  Neurological:     General: No focal deficit present.     Mental Status:  He is alert and oriented to person, place, and time.        Assessment & Plan:   No problem-specific Assessment & Plan notes found for this encounter.    Return in about 3 months (around 04/15/2024).   Roetta Dare, MD

## 2024-01-14 NOTE — Assessment & Plan Note (Signed)
 He will see vascular surgeon for possible bypass this Friday

## 2024-01-14 NOTE — Assessment & Plan Note (Signed)
His BP is stable  

## 2024-01-15 LAB — CMP14 + ANION GAP
ALT: 25 IU/L (ref 0–44)
AST: 20 IU/L (ref 0–40)
Albumin: 4.6 g/dL (ref 3.9–4.9)
Alkaline Phosphatase: 36 IU/L — ABNORMAL LOW (ref 47–123)
Anion Gap: 17 mmol/L (ref 10.0–18.0)
BUN/Creatinine Ratio: 17 (ref 10–24)
BUN: 22 mg/dL (ref 8–27)
Bilirubin Total: 0.5 mg/dL (ref 0.0–1.2)
CO2: 24 mmol/L (ref 20–29)
Calcium: 9.5 mg/dL (ref 8.6–10.2)
Chloride: 102 mmol/L (ref 96–106)
Creatinine, Ser: 1.31 mg/dL — ABNORMAL HIGH (ref 0.76–1.27)
Globulin, Total: 1.9 g/dL (ref 1.5–4.5)
Glucose: 153 mg/dL — ABNORMAL HIGH (ref 70–99)
Potassium: 4.3 mmol/L (ref 3.5–5.2)
Sodium: 143 mmol/L (ref 134–144)
Total Protein: 6.5 g/dL (ref 6.0–8.5)
eGFR: 59 mL/min/1.73 — ABNORMAL LOW (ref >59)

## 2024-01-17 ENCOUNTER — Ambulatory Visit: Payer: Self-pay

## 2024-01-17 NOTE — H&P (View-Only) (Signed)
 Patient ID: Bryan Mccormick, male   DOB: 1955/05/28, 68 y.o.   MRN: [id number]  Reason for Consult: New Patient (Initial Visit)   Referred by Amin, Saad, MD  Subjective:     HPI Bryan Mccormick is a 68 y.o. male presenting for evaluation of leg pain/tiredness. He reports significant claudication and short distance.  He explains that he can barely make it to the car from the office without having significant weakness and fatigue as well as lower extremity pain.  He is also having left foot pain at night and reports that he gets up and walks around in order to relieve this pain.  He denies any slow or nonhealing ulcers.  He does have a history of left iliac stents done at [facility] many years ago.  He is a former smoker and quit many years ago.  He is currently compliant with aspirin and Lipitor.  Past Medical History:  Diagnosis Date   Diabetes mellitus without complication (HCC)    Hyperlipidemia    Hypertension    Thyroid  disease    History reviewed. No pertinent family history. Past Surgical History:  Procedure Laterality Date   HYDROCELE EXCISION / REPAIR Bilateral    PROSTATE SURGERY     prosthetic implants penis     TONSILLECTOMY AND ADENOIDECTOMY      Short Social History:  Social History   Tobacco Use   Smoking status: Former    Current packs/day: 0.50    Types: Cigarettes   Smokeless tobacco: Current    Types: Chew   Tobacco comments:    Sometimes chew   Substance Use Topics   Alcohol use: Never    Allergies  Allergen Reactions   Niacin Itching    Current Outpatient Medications  Medication Sig Dispense Refill   albuterol  (VENTOLIN  HFA) 108 (90 Base) MCG/ACT inhaler INHALE 1 PUFF EVERY 6 HOURS AS NEEDED 8.5 g 0   ALPRAZolam  (XANAX ) 1 MG tablet Take 1 tablet (1 mg total) by mouth 2 (two) times daily as needed for anxiety. 60 tablet 2   amLODipine  (NORVASC ) 5 MG tablet Take 1 tablet (5 mg total) by mouth daily. 90 tablet 3   aspirin 325 MG tablet 1 tablet (325 mg  total) daily in the morning.     atorvastatin  (LIPITOR) 80 MG tablet Take 1 tablet (80 mg total) by mouth at bedtime. 90 tablet 0   celecoxib  (CELEBREX ) 200 MG capsule Take 1 capsule (200 mg total) by mouth daily. 30 capsule 2   clotrimazole -betamethasone  (LOTRISONE ) cream Apply topically 2 (two) times daily. 30 g 3   divalproex  (DEPAKOTE  ER) 250 MG 24 hr tablet TAKE 1 TABLET BY MOUTH TWICE DAILY 180 tablet 1   fenofibrate  (TRICOR ) 145 MG tablet TAKE 1 TABLET BY MOUTH DAILY. 90 tablet 0   finasteride  (PROSCAR ) 5 MG tablet TAKE 1 TABLET BY MOUTH DAILY 90 tablet 2   furosemide  (LASIX ) 40 MG tablet TAKE 1 TABLET BY MOUTH DAILY. 90 tablet 0   hydrochlorothiazide (HYDRODIURIL) 25 MG tablet TAKE 1 TABLET BY MOUTH DAILY ALONG WITH LOSARTAN  30 tablet 5   ipratropium-albuterol  (DUONEB) 0.5-2.5 (3) MG/3ML SOLN Take 3 mLs by nebulization every 6 (six) hours as needed. 360 mL 1   levothyroxine  (SYNTHROID ) 75 MCG tablet TAKE 1 TABLET BY MOUTH DAILY. EXCEPT ON SUNDAYS TAKE 2 TABLETS. 96 tablet 0   losartan  (COZAAR ) 100 MG tablet TAKE 1 TABLET BY MOUTH DAILY ALONG WITH HCTZ 30 tablet 5   losartan -hydrochlorothiazide (HYZAAR) 100-25 MG tablet Take  1 tablet by mouth daily. 30 tablet 6   metFORMIN  (GLUCOPHAGE -XR) 500 MG 24 hr tablet Take 1 tablet (500 mg total) by mouth 2 (two) times daily with a meal. 180 tablet 1   methocarbamol (ROBAXIN) 750 MG tablet Take 1 tablet (750 mg total) by mouth every 8 (eight) hours as needed for muscle spasms. 60 tablet 1   nitroGLYCERIN  (NITROSTAT ) 0.4 MG SL tablet Place 1 tablet (0.4 mg total) under the tongue every 5 (five) minutes as needed for chest pain. 20 tablet 6   nystatin  cream (MYCOSTATIN ) Apply topically 2 (two) times daily. 30 g 1   Omega-3 Fatty Acids (OMEGA-3 FISH OIL) 1200 MG CAPS Take by mouth.     omeprazole  (PRILOSEC) 40 MG capsule TAKE 1 CAPSULE DAILY BEFORE BEDTIME 90 capsule 0   potassium chloride  SA (KLOR-CON  M) 20 MEQ tablet TAKE 1 TABLET BY MOUTH DAILY. 90  tablet 0   predniSONE  (DELTASONE ) 10 MG tablet Take 4 tablets (40 mg total) by mouth daily with breakfast. 25 tablet 0   SHINGRIX injection      tamsulosin  (FLOMAX ) 0.4 MG CAPS capsule TAKE 2 CAPSULES BY MOUTH DAILY. 180 capsule 0   No current facility-administered medications for this visit.    REVIEW OF SYSTEMS  All other systems were reviewed and are negative     Objective:  Objective   Vitals:   01/18/24 1039  BP: 139/64  Pulse: 66  Resp: 18  Temp: 98 F (36.7 C)  TempSrc: Temporal  SpO2: 96%  Weight: 210 lb (95.3 kg)  Height: 5' 7.5 (1.715 m)   Body mass index is 32.41 kg/m.  Physical Exam General: no acute distress Cardiac: hemodynamically stable Abdomen: non-tender, no pulsatile mass Extremities: no edema, cyanosis or wounds Vascular:   Right: Palpable femoral, nonpalpable pedal's  Left: Palpable femoral, nonpalpable pedal  Data: ABI ABI Findings:  +---------+------------------+-----+--------+--------+  Right   Rt Pressure (mmHg)IndexWaveformComment   +---------+------------------+-----+--------+--------+  Brachial 137                                      +---------+------------------+-----+--------+--------+  PTA     106               0.71 biphasic          +---------+------------------+-----+--------+--------+  DP      95                0.64 biphasic          +---------+------------------+-----+--------+--------+  Great Toe61                0.41                   +---------+------------------+-----+--------+--------+   +---------+------------------+-----+--------+-------+  Left    Lt Pressure (mmHg)IndexWaveformComment  +---------+------------------+-----+--------+-------+  Brachial 149                                     +---------+------------------+-----+--------+-------+  PTA     108               0.72 biphasic         +---------+------------------+-----+--------+-------+  DP      98                 0.66 biphasic         +---------+------------------+-----+--------+-------+  Great Toe64                0.43                  +---------+------------------+-----+--------+-------+   CT abdomen pelvis from May 2024 was reviewed No evidence of AAA.  Patent left common and external iliac stents  CMP reviewed, creatinine 1.3     Assessment/Plan:   Bryan Mccormick is a 68 y.o. male with a history of PAD and history of left common and external iliac stenting. Today he is describing symptoms of life-limiting claudication as well as some rest pain on the left lower extremity.  We discussed the natural history of PAD as well as its risk factors.  We discussed the amputation risk of CL TI and therefore offered angiogram to better assess his perfusion and potentially treat any flow-limiting stenoses. 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 The patient is on best medical therapy for peripheral arterial disease. The patient has been counseled about the risks of tobacco use in atherosclerotic disease. The patient has been counseled to abstain from any tobacco use. An aortogram with bilateral lower extremity runoff angiography and Left lower extremity intervention and is indicated to better evaluate the patient's lower extremity circulation because of the limb threatening nature of the patient's diagnosis. Based on the patient's clinical exam and non-invasive data, we anticipate an endovascular intervention in the iliac and femoropopliteal vessels. Stenting and/or athrectomy would be favored because of the improved primary patency of these interventions as compared to plain balloon angioplasty.   Recommendations to optimize cardiovascular risk: Abstinence from all tobacco products. Blood glucose control with goal A1c < 7%. Blood  pressure control with goal blood pressure < 140/90 mmHg. Lipid reduction therapy with goal LDL-C <55 mg/dL  Aspirin 81mg  PO QD.  Atorvastatin  40-80mg  PO QD (or other high intensity statin therapy).   Bryan Mccormick Serve MD [facility]

## 2024-01-17 NOTE — Progress Notes (Signed)
 Patient ID: Bryan Mccormick, male   DOB: Dec 20, 1955, 68 y.o.   MRN: [id number]  Reason for Consult: New Patient (Initial Visit)   Referred by Amin, Saad, MD  Subjective:     HPI Bryan Mccormick is a 68 y.o. male presenting for evaluation of leg pain/tiredness. He reports significant claudication and short distance.  He explains that he can barely make it to the car from the office without having significant weakness and fatigue as well as lower extremity pain.  He is also having left foot pain at night and reports that he gets up and walks around in order to relieve this pain.  He denies any slow or nonhealing ulcers.  He does have a history of left iliac stents done at [facility] many years ago.  He is a former smoker and quit many years ago.  He is currently compliant with aspirin and Lipitor.  Past Medical History:  Diagnosis Date   Diabetes mellitus without complication (HCC)    Hyperlipidemia    Hypertension    Thyroid  disease    History reviewed. No pertinent family history. Past Surgical History:  Procedure Laterality Date   HYDROCELE EXCISION / REPAIR Bilateral    PROSTATE SURGERY     prosthetic implants penis     TONSILLECTOMY AND ADENOIDECTOMY      Short Social History:  Social History   Tobacco Use   Smoking status: Former    Current packs/day: 0.50    Types: Cigarettes   Smokeless tobacco: Current    Types: Chew   Tobacco comments:    Sometimes chew   Substance Use Topics   Alcohol use: Never    Allergies  Allergen Reactions   Niacin Itching    Current Outpatient Medications  Medication Sig Dispense Refill   albuterol  (VENTOLIN  HFA) 108 (90 Base) MCG/ACT inhaler INHALE 1 PUFF EVERY 6 HOURS AS NEEDED 8.5 g 0   ALPRAZolam  (XANAX ) 1 MG tablet Take 1 tablet (1 mg total) by mouth 2 (two) times daily as needed for anxiety. 60 tablet 2   amLODipine  (NORVASC ) 5 MG tablet Take 1 tablet (5 mg total) by mouth daily. 90 tablet 3   aspirin 325 MG tablet 1 tablet (325 mg  total) daily in the morning.     atorvastatin  (LIPITOR) 80 MG tablet Take 1 tablet (80 mg total) by mouth at bedtime. 90 tablet 0   celecoxib  (CELEBREX ) 200 MG capsule Take 1 capsule (200 mg total) by mouth daily. 30 capsule 2   clotrimazole -betamethasone  (LOTRISONE ) cream Apply topically 2 (two) times daily. 30 g 3   divalproex  (DEPAKOTE  ER) 250 MG 24 hr tablet TAKE 1 TABLET BY MOUTH TWICE DAILY 180 tablet 1   fenofibrate  (TRICOR ) 145 MG tablet TAKE 1 TABLET BY MOUTH DAILY. 90 tablet 0   finasteride  (PROSCAR ) 5 MG tablet TAKE 1 TABLET BY MOUTH DAILY 90 tablet 2   furosemide  (LASIX ) 40 MG tablet TAKE 1 TABLET BY MOUTH DAILY. 90 tablet 0   hydrochlorothiazide (HYDRODIURIL) 25 MG tablet TAKE 1 TABLET BY MOUTH DAILY ALONG WITH LOSARTAN  30 tablet 5   ipratropium-albuterol  (DUONEB) 0.5-2.5 (3) MG/3ML SOLN Take 3 mLs by nebulization every 6 (six) hours as needed. 360 mL 1   levothyroxine  (SYNTHROID ) 75 MCG tablet TAKE 1 TABLET BY MOUTH DAILY. EXCEPT ON SUNDAYS TAKE 2 TABLETS. 96 tablet 0   losartan  (COZAAR ) 100 MG tablet TAKE 1 TABLET BY MOUTH DAILY ALONG WITH HCTZ 30 tablet 5   losartan -hydrochlorothiazide (HYZAAR) 100-25 MG tablet Take  1 tablet by mouth daily. 30 tablet 6   metFORMIN  (GLUCOPHAGE -XR) 500 MG 24 hr tablet Take 1 tablet (500 mg total) by mouth 2 (two) times daily with a meal. 180 tablet 1   methocarbamol (ROBAXIN) 750 MG tablet Take 1 tablet (750 mg total) by mouth every 8 (eight) hours as needed for muscle spasms. 60 tablet 1   nitroGLYCERIN  (NITROSTAT ) 0.4 MG SL tablet Place 1 tablet (0.4 mg total) under the tongue every 5 (five) minutes as needed for chest pain. 20 tablet 6   nystatin  cream (MYCOSTATIN ) Apply topically 2 (two) times daily. 30 g 1   Omega-3 Fatty Acids (OMEGA-3 FISH OIL) 1200 MG CAPS Take by mouth.     omeprazole  (PRILOSEC) 40 MG capsule TAKE 1 CAPSULE DAILY BEFORE BEDTIME 90 capsule 0   potassium chloride  SA (KLOR-CON  M) 20 MEQ tablet TAKE 1 TABLET BY MOUTH DAILY. 90  tablet 0   predniSONE  (DELTASONE ) 10 MG tablet Take 4 tablets (40 mg total) by mouth daily with breakfast. 25 tablet 0   SHINGRIX injection      tamsulosin  (FLOMAX ) 0.4 MG CAPS capsule TAKE 2 CAPSULES BY MOUTH DAILY. 180 capsule 0   No current facility-administered medications for this visit.    REVIEW OF SYSTEMS  All other systems were reviewed and are negative     Objective:  Objective   Vitals:   01/18/24 1039  BP: 139/64  Pulse: 66  Resp: 18  Temp: 98 F (36.7 C)  TempSrc: Temporal  SpO2: 96%  Weight: 210 lb (95.3 kg)  Height: 5' 7.5 (1.715 m)   Body mass index is 32.41 kg/m.  Physical Exam General: no acute distress Cardiac: hemodynamically stable Abdomen: non-tender, no pulsatile mass Extremities: no edema, cyanosis or wounds Vascular:   Right: Palpable femoral, nonpalpable pedal's  Left: Palpable femoral, nonpalpable pedal  Data: ABI ABI Findings:  +---------+------------------+-----+--------+--------+  Right   Rt Pressure (mmHg)IndexWaveformComment   +---------+------------------+-----+--------+--------+  Brachial 137                                      +---------+------------------+-----+--------+--------+  PTA     106               0.71 biphasic          +---------+------------------+-----+--------+--------+  DP      95                0.64 biphasic          +---------+------------------+-----+--------+--------+  Great Toe61                0.41                   +---------+------------------+-----+--------+--------+   +---------+------------------+-----+--------+-------+  Left    Lt Pressure (mmHg)IndexWaveformComment  +---------+------------------+-----+--------+-------+  Brachial 149                                     +---------+------------------+-----+--------+-------+  PTA     108               0.72 biphasic         +---------+------------------+-----+--------+-------+  DP      98                 0.66 biphasic         +---------+------------------+-----+--------+-------+  Great Toe64                0.43                  +---------+------------------+-----+--------+-------+   CT abdomen pelvis from May 2024 was reviewed No evidence of AAA.  Patent left common and external iliac stents  CMP reviewed, creatinine 1.3     Assessment/Plan:   Bryan Mccormick is a 68 y.o. male with a history of PAD and history of left common and external iliac stenting. Today he is describing symptoms of life-limiting claudication as well as some rest pain on the left lower extremity.  We discussed the natural history of PAD as well as its risk factors.  We discussed the amputation risk of CL TI and therefore offered angiogram to better assess his perfusion and potentially treat any flow-limiting stenoses. 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 The patient is on best medical therapy for peripheral arterial disease. The patient has been counseled about the risks of tobacco use in atherosclerotic disease. The patient has been counseled to abstain from any tobacco use. An aortogram with bilateral lower extremity runoff angiography and Left lower extremity intervention and is indicated to better evaluate the patient's lower extremity circulation because of the limb threatening nature of the patient's diagnosis. Based on the patient's clinical exam and non-invasive data, we anticipate an endovascular intervention in the iliac and femoropopliteal vessels. Stenting and/or athrectomy would be favored because of the improved primary patency of these interventions as compared to plain balloon angioplasty.   Recommendations to optimize cardiovascular risk: Abstinence from all tobacco products. Blood glucose control with goal A1c < 7%. Blood  pressure control with goal blood pressure < 140/90 mmHg. Lipid reduction therapy with goal LDL-C <55 mg/dL  Aspirin 81mg  PO QD.  Atorvastatin  40-80mg  PO QD (or other high intensity statin therapy).   Norman GORMAN Serve MD [facility]

## 2024-01-18 ENCOUNTER — Encounter: Payer: Self-pay | Admitting: Vascular Surgery

## 2024-01-18 ENCOUNTER — Ambulatory Visit (HOSPITAL_COMMUNITY)
Admission: RE | Admit: 2024-01-18 | Discharge: 2024-01-18 | Disposition: A | Discharge status: Home / Self Care | Source: Ambulatory Visit | Attending: Vascular Surgery | Admitting: Vascular Surgery

## 2024-01-18 ENCOUNTER — Ambulatory Visit (INDEPENDENT_AMBULATORY_CARE_PROVIDER_SITE_OTHER): Admitting: Vascular Surgery

## 2024-01-18 ENCOUNTER — Other Ambulatory Visit: Payer: Self-pay

## 2024-01-18 VITALS — BP 139/64 | HR 66 | Temp 98.0°F | Resp 18 | Ht 67.5 in | Wt 210.0 lb

## 2024-01-18 DIAGNOSIS — I739 Peripheral vascular disease, unspecified: Secondary | ICD-10-CM

## 2024-01-18 DIAGNOSIS — I70222 Atherosclerosis of native arteries of extremities with rest pain, left leg: Secondary | ICD-10-CM

## 2024-01-18 LAB — VAS US ABI WITH/WO TBI
Left ABI: 0.72
Right ABI: 0.71

## 2024-01-23 NOTE — Progress Notes (Signed)
 Patient called.  Patient aware.  Dr caleen : His labs stable

## 2024-01-28 ENCOUNTER — Other Ambulatory Visit: Payer: Self-pay

## 2024-01-28 ENCOUNTER — Ambulatory Visit (HOSPITAL_COMMUNITY)
Admission: RE | Admit: 2024-01-28 | Discharge: 2024-01-28 | Disposition: A | Discharge status: Home / Self Care | Attending: Vascular Surgery | Admitting: Vascular Surgery

## 2024-01-28 ENCOUNTER — Other Ambulatory Visit (HOSPITAL_COMMUNITY): Payer: Self-pay

## 2024-01-28 ENCOUNTER — Encounter (HOSPITAL_COMMUNITY)
Admission: RE | Disposition: A | Discharge status: Home / Self Care | Payer: Self-pay | Source: Home / Self Care | Attending: Vascular Surgery

## 2024-01-28 DIAGNOSIS — I70223 Atherosclerosis of native arteries of extremities with rest pain, bilateral legs: Secondary | ICD-10-CM | POA: Diagnosis present

## 2024-01-28 DIAGNOSIS — Z7982 Long term (current) use of aspirin: Secondary | ICD-10-CM | POA: Insufficient documentation

## 2024-01-28 DIAGNOSIS — I70222 Atherosclerosis of native arteries of extremities with rest pain, left leg: Secondary | ICD-10-CM | POA: Diagnosis not present

## 2024-01-28 DIAGNOSIS — Y831 Surgical operation with implant of artificial internal device as the cause of abnormal reaction of the patient, or of later complication, without mention of misadventure at the time of the procedure: Secondary | ICD-10-CM | POA: Insufficient documentation

## 2024-01-28 DIAGNOSIS — I745 Embolism and thrombosis of iliac artery: Secondary | ICD-10-CM | POA: Diagnosis not present

## 2024-01-28 DIAGNOSIS — T82856A Stenosis of peripheral vascular stent, initial encounter: Secondary | ICD-10-CM | POA: Diagnosis not present

## 2024-01-28 DIAGNOSIS — Z79899 Other long term (current) drug therapy: Secondary | ICD-10-CM | POA: Insufficient documentation

## 2024-01-28 DIAGNOSIS — Z87891 Personal history of nicotine dependence: Secondary | ICD-10-CM | POA: Diagnosis not present

## 2024-01-28 DIAGNOSIS — Z95828 Presence of other vascular implants and grafts: Secondary | ICD-10-CM | POA: Diagnosis not present

## 2024-01-28 DIAGNOSIS — I82521 Chronic embolism and thrombosis of right iliac vein: Secondary | ICD-10-CM | POA: Diagnosis not present

## 2024-01-28 HISTORY — PX: LOWER EXTREMITY INTERVENTION: CATH118252

## 2024-01-28 HISTORY — PX: ABDOMINAL AORTOGRAM W/LOWER EXTREMITY: CATH118223

## 2024-01-28 LAB — POCT I-STAT, CHEM 8
BUN: 34 mg/dL — ABNORMAL HIGH (ref 8–23)
Calcium, Ion: 1.15 mmol/L (ref 1.15–1.40)
Chloride: 97 mmol/L — ABNORMAL LOW (ref 98–111)
Creatinine, Ser: 1.6 mg/dL — ABNORMAL HIGH (ref 0.61–1.24)
Glucose, Bld: 128 mg/dL — ABNORMAL HIGH (ref 70–99)
HCT: 39 % (ref 39.0–52.0)
Hemoglobin: 13.3 g/dL (ref 13.0–17.0)
Potassium: 3.3 mmol/L — ABNORMAL LOW (ref 3.5–5.1)
Sodium: 136 mmol/L (ref 135–145)
TCO2: 27 mmol/L (ref 22–32)

## 2024-01-28 SURGERY — ABDOMINAL AORTOGRAM W/LOWER EXTREMITY
Anesthesia: LOCAL

## 2024-01-28 MED ORDER — LIDOCAINE HCL (PF) 1 % IJ SOLN
INTRAMUSCULAR | Status: DC | PRN
  Administered 2024-01-28 (×2): 15 mL

## 2024-01-28 MED ORDER — ASPIRIN 81 MG PO TBEC: 81.0000 mg | DELAYED_RELEASE_TABLET | Freq: Every day | ORAL | Status: DC

## 2024-01-28 MED ORDER — SODIUM CHLORIDE 0.9% FLUSH: 3.0000 mL | Freq: Two times a day (BID) | INTRAVENOUS | Status: DC

## 2024-01-28 MED ORDER — MIDAZOLAM HCL 2 MG/2ML IJ SOLN
INTRAMUSCULAR | Status: AC
  Filled 2024-01-28: qty 2

## 2024-01-28 MED ORDER — SODIUM CHLORIDE 0.9% FLUSH: 3.0000 mL | INTRAVENOUS | Status: DC | PRN

## 2024-01-28 MED ORDER — CLOPIDOGREL BISULFATE 300 MG PO TABS
ORAL_TABLET | ORAL | Status: DC | PRN
  Administered 2024-01-28: 300 mg via ORAL

## 2024-01-28 MED ORDER — MIDAZOLAM HCL (PF) 2 MG/2ML IJ SOLN
INTRAMUSCULAR | Status: DC | PRN
  Administered 2024-01-28: 1 mg via INTRAVENOUS

## 2024-01-28 MED ORDER — HEPARIN (PORCINE) IN NACL 1000-0.9 UT/500ML-% IV SOLN
INTRAVENOUS | Status: DC | PRN
  Administered 2024-01-28 (×2): 500 mL

## 2024-01-28 MED ORDER — ACETAMINOPHEN 325 MG PO TABS: 650.0000 mg | ORAL_TABLET | ORAL | Status: DC | PRN

## 2024-01-28 MED ORDER — CILOSTAZOL 100 MG PO TABS
100.0000 mg | ORAL_TABLET | Freq: Two times a day (BID) | ORAL | 0 refills | Status: AC
  Filled 2024-01-28: qty 180, 90d supply, fill #0

## 2024-01-28 MED ORDER — FENTANYL CITRATE (PF) 100 MCG/2ML IJ SOLN
INTRAMUSCULAR | Status: AC
  Filled 2024-01-28: qty 2

## 2024-01-28 MED ORDER — SODIUM CHLORIDE 0.9 % WEIGHT BASED INFUSION: 1.0000 mL/kg/h | INTRAVENOUS | Status: DC

## 2024-01-28 MED ORDER — IODIXANOL 320 MG/ML IV SOLN
INTRAVENOUS | Status: DC | PRN
  Administered 2024-01-28: 130 mL

## 2024-01-28 MED ORDER — HEPARIN SODIUM (PORCINE) 1000 UNIT/ML IJ SOLN
INTRAMUSCULAR | Status: AC
  Filled 2024-01-28: qty 10

## 2024-01-28 MED ORDER — HYDRALAZINE HCL 20 MG/ML IJ SOLN: 5.0000 mg | INTRAMUSCULAR | Status: DC | PRN

## 2024-01-28 MED ORDER — CLOPIDOGREL BISULFATE 75 MG PO TABS
75.0000 mg | ORAL_TABLET | Freq: Every day | ORAL | 0 refills | Status: AC
  Filled 2024-01-28: qty 90, 90d supply, fill #0

## 2024-01-28 MED ORDER — ONDANSETRON HCL 4 MG/2ML IJ SOLN: 4.0000 mg | Freq: Four times a day (QID) | INTRAMUSCULAR | Status: DC | PRN

## 2024-01-28 MED ORDER — FENTANYL CITRATE (PF) 100 MCG/2ML IJ SOLN
INTRAMUSCULAR | Status: DC | PRN
  Administered 2024-01-28 (×2): 50 ug via INTRAVENOUS

## 2024-01-28 MED ORDER — HEPARIN SODIUM (PORCINE) 1000 UNIT/ML IJ SOLN
INTRAMUSCULAR | Status: DC | PRN
  Administered 2024-01-28: 9000 [IU] via INTRAVENOUS

## 2024-01-28 MED ORDER — LIDOCAINE HCL (PF) 1 % IJ SOLN
INTRAMUSCULAR | Status: AC
  Filled 2024-01-28: qty 30

## 2024-01-28 MED ORDER — LABETALOL HCL 5 MG/ML IV SOLN: 10.0000 mg | INTRAVENOUS | Status: DC | PRN

## 2024-01-28 MED ORDER — CLOPIDOGREL BISULFATE 300 MG PO TABS
ORAL_TABLET | ORAL | Status: AC
  Filled 2024-01-28: qty 1

## 2024-01-28 MED ORDER — SODIUM CHLORIDE 0.9 % IV SOLN: 250.0000 mL | INTRAVENOUS | Status: DC | PRN

## 2024-01-28 MED ORDER — SODIUM CHLORIDE 0.9 % IV SOLN: INTRAVENOUS | Status: DC

## 2024-01-28 SURGICAL SUPPLY — 16 items
BALLOON LUTONIX AV 8X60X75 (BALLOONS) IMPLANT
CATH OMNI FLUSH 5F 65CM (CATHETERS) IMPLANT
CLOSURE MYNX CONTROL 5F (Vascular Products) IMPLANT
CLOSURE MYNX CONTROL 6F/7F (Vascular Products) IMPLANT
GLIDEWIRE ADV .035X260CM (WIRE) IMPLANT
INTRODUCER 7FR 23CM (INTRODUCER) IMPLANT
KIT ENCORE 26 ADVANTAGE (KITS) IMPLANT
KIT MICROPUNCTURE NIT STIFF (SHEATH) IMPLANT
KIT SINGLE USE MANIFOLD (KITS) IMPLANT
PACK CARDIAC CATHETERIZATION (CUSTOM PROCEDURE TRAY) IMPLANT
SET ATX-X65L (MISCELLANEOUS) IMPLANT
SHEATH PINNACLE 5F 10CM (SHEATH) IMPLANT
SHEATH PINNACLE 7F 10CM (SHEATH) IMPLANT
SHEATH PROBE COVER 6X72 (BAG) IMPLANT
STENT VIABAHN 29XCATH 80 (Permanent Stent) IMPLANT
WIRE BENTSON .035X145CM (WIRE) IMPLANT

## 2024-01-28 NOTE — Progress Notes (Signed)
 Discharge instructions reviewed with patient and fiance Orlean at the bedside. Denies questions or concerns. PT ambulated in the hallway, was able to void in the bathroom without difficulty. Tolerated PO intake. No s/s of complications at the incision site. PT escorted from the unit via wheel chair to personal vehicle.

## 2024-01-28 NOTE — Discharge Instructions (Signed)
 Femoral Site Care This sheet gives you information about how to care for yourself after your procedure. Your health care provider may also give you more specific instructions. If you have problems or questions, contact your health care provider. What can I expect after the procedure?  After the procedure, it is common to have: Bruising that usually fades within 1-2 weeks. Tenderness at the site. Follow these instructions at home: Wound care Follow instructions from your health care provider about how to take care of your insertion site. Make sure you: Wash your hands with soap and water before you change your bandage (dressing). If soap and water are not available, use hand sanitizer. Remove your dressing as told by your health care provider. In 24 hours Do not take baths, swim, or use a hot tub until your health care provider approves. You may shower 24-48 hours after the procedure or as told by your health care provider. Gently wash the site with plain soap and water. Pat the area dry with a clean towel. Do not rub the site. This may cause bleeding. Do not apply powder or lotion to the site. Keep the site clean and dry. Check your femoral site every day for signs of infection. Check for: Redness, swelling, or pain. Fluid or blood. Warmth. Pus or a bad smell. Activity For the first 2-3 days after your procedure, or as long as directed: Avoid climbing stairs as much as possible. Do not squat. Do not lift anything that is heavier than 10 lb (4.5 kg), or the limit that you are told, until your health care provider says that it is safe. For 5 days Rest as directed. Avoid sitting for a long time without moving. Get up to take short walks every 1-2 hours. Do not drive for 24 hours if you were given a medicine to help you relax (sedative). General instructions Take over-the-counter and prescription medicines only as told by your health care provider. Keep all follow-up visits as told by  your health care provider. This is important. Contact a health care provider if you have: A fever or chills. You have redness, swelling, or pain around your insertion site. Get help right away if: The catheter insertion area swells very fast. You pass out. You suddenly start to sweat or your skin gets clammy. The catheter insertion area is bleeding, and the bleeding does not stop when you hold steady pressure on the area. The area near or just beyond the catheter insertion site becomes pale, cool, tingly, or numb. These symptoms may represent a serious problem that is an emergency. Do not wait to see if the symptoms will go away. Get medical help right away. Call your local emergency services (911 in the [place] Do not drive yourself to the hospital. Summary After the procedure, it is common to have bruising that usually fades within 1-2 weeks. Check your femoral site every day for signs of infection. Do not lift anything that is heavier than 10 lb (4.5 kg), or the limit that you are told, until your health care provider says that it is safe. This information is not intended to replace advice given to you by your health care provider. Make sure you discuss any questions you have with your health care provider. Document Revised: 02/26/2017 Document Reviewed: 02/26/2017 Elsevier Patient Education  2020 [employer].

## 2024-01-28 NOTE — Interval H&P Note (Signed)
 History and Physical Interval Note:  01/28/2024 9:27 AM  Bryan Mccormick  has presented today for surgery, with the diagnosis of ischemia left leg.  The various methods of treatment have been discussed with the patient and family. After consideration of risks, benefits and other options for treatment, the patient has consented to  Procedure(s): ABDOMINAL AORTOGRAM W/LOWER EXTREMITY (N/A) Lower Extremity Angiography (N/A) LOWER EXTREMITY INTERVENTION (N/A) as a surgical intervention.  The patient's history has been reviewed, patient examined, no change in status, stable for surgery.  I have reviewed the patient's chart and labs.  Questions were answered to the patient's satisfaction.     Norman GORMAN Serve

## 2024-01-28 NOTE — Op Note (Signed)
 Patient name: Bryan Mccormick MRN: [id number] DOB: 1955-05-12 Sex: male  01/28/2024 Pre-operative Diagnosis: Lifestyle limiting claudication and rest pain Post-operative diagnosis:  Same Surgeon:  Norman GORMAN Serve, MD Procedure Performed:  Ultrasound-guided access of right common femoral artery Ultrasound-guided access of left common femoral artery Aortogram and bilateral lower extremity angiogram Second-order cannulation of left common femoral artery Balloon angioplasty and stenting of the left common iliac artery, 8 mm x 29 mm VBX Drug-coated balloon angioplasty of the left external iliac stent, 8 mm x 60 mm lutonix Mynx closure of right common femoral artery Mynx closure of left common femoral artery 57 minutes of moderate sedation with fentanyl  and Versed   Indications: Bryan Mccormick is a 68 year old male who presented to the office with complaints of lifestyle-limiting claudication mainly in his hip and buttock.  He also was describing significant left foot pain at night that causes him to wake up and he has to walk around for relief.  He had previous placed left iliac stents years ago for claudication.  Bilateral angiogram was offered, risks and benefits reviewed and he elected to proceed.  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 The right common and external iliacs are widely patent.  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 The left proximal edge of the common iliac stent appears to have a severe flow-limiting stenosis.  There is an approximate 50% stenosis at the midportion of the stent.  The distal is widely patent.  The stents appear to be slightly undersized.  The left internal iliac is covered and does not appear to reconstitute.  Right common femoral moderately diseased, SFA and profunda are widely patent and popliteal artery is widely patent.  There is three-vessel runoff without flow-limiting stenosis.  Left common femoral, SFA and profunda are widely patent.   The popliteal is widely patent.  There is three-vessel runoff without flow-limiting stenosis.   Procedure:  The patient was identified in the holding area and taken to the cath lab  The patient was then placed supine on the table and prepped and draped in the usual sterile fashion.  A time out was called.  Ultrasound was used to evaluate the right common femoral artery.  It was patent .  A digital ultrasound image was acquired.  A micropuncture needle was used to access the right common femoral artery under ultrasound guidance.  An 018 wire was advanced without resistance and a micropuncture sheath was placed.  The 018 wire was removed and a benson wire was placed.  The micropuncture sheath was exchanged for a 5 french sheath.  An omniflush catheter was advanced over the wire to the level of L-1.  An abdominal angiogram was obtained.  Next, using the omniflush catheter and a glide advantage wire, the aortic bifurcation was crossed and the catheter was placed into theleft external iliac artery and left runoff was obtained. This demonstrated the above findings. Right runoff was performed via retrograde sheath injections which demonstrated the above findings.  Multiple projections were taken of the aortic bifurcation and bilateral iliac arteries which demonstrated the above findings.  Given the severe stenosis of the proximal edge of the left iliac stents we elected to treat for ipsilateral access with a VBX.  Using ultrasound the left common femoral artery was evaluated, it was patent.  A micropuncture needle was used to access the left common femoral artery under ultrasound guidance and the 018 wire was advanced without resistance.  The micropuncture sheath was then placed over this  wire.  A glide advantage wire was then advanced into the infrarenal aorta and the access was upsized to a 7 French Brite tip sheath.  An 8 x 29 VBX was then placed into position at the aortic bifurcation.  An angiogram from the right  common femoral access was obtained and the aortic bifurcation was marked.  The VBX was then deployed with the proximal edge of the stent at the aortic bifurcation.  Given the approximate 50% stenosis in the midportion of the external iliac stent I elected to treat with an 8 mm x 60 mm tonics balloon.  A completion angiogram demonstrated an excellent result without residual stenosis at the proximal stent edge and excellent flow into bilateral iliac systems.  Both common femoral access sites were then closed with Mynx closure devices.   Contrast: 130 cc Sedation: 57 minutes  Impression: Severe stenosis at the proximal stent edge of the left common iliac stent treated with an 8 x 29 mm VBX.  In-stent stenosis treated with a millimeter by 60 mm lutonix. 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  Runoff patent bilaterally without flow-limiting stenosis.  Some disease in the right common femoral.   Norman GORMAN Serve MD [facility] Office: [phone]

## 2024-01-29 ENCOUNTER — Encounter (HOSPITAL_COMMUNITY): Payer: Self-pay | Admitting: Vascular Surgery

## 2024-01-31 ENCOUNTER — Emergency Department (HOSPITAL_COMMUNITY)
Admission: EM | Admit: 2024-01-31 | Discharge: 2024-02-01 | Disposition: A | Attending: Emergency Medicine | Admitting: Emergency Medicine

## 2024-01-31 ENCOUNTER — Encounter (HOSPITAL_COMMUNITY): Payer: Self-pay

## 2024-01-31 DIAGNOSIS — X58XXXA Exposure to other specified factors, initial encounter: Secondary | ICD-10-CM | POA: Insufficient documentation

## 2024-01-31 DIAGNOSIS — I729 Aneurysm of unspecified site: Secondary | ICD-10-CM | POA: Insufficient documentation

## 2024-01-31 DIAGNOSIS — E119 Type 2 diabetes mellitus without complications: Secondary | ICD-10-CM | POA: Insufficient documentation

## 2024-01-31 DIAGNOSIS — Z87891 Personal history of nicotine dependence: Secondary | ICD-10-CM | POA: Insufficient documentation

## 2024-01-31 DIAGNOSIS — Z7902 Long term (current) use of antithrombotics/antiplatelets: Secondary | ICD-10-CM | POA: Insufficient documentation

## 2024-01-31 DIAGNOSIS — Z79899 Other long term (current) drug therapy: Secondary | ICD-10-CM | POA: Insufficient documentation

## 2024-01-31 DIAGNOSIS — I10 Essential (primary) hypertension: Secondary | ICD-10-CM | POA: Insufficient documentation

## 2024-01-31 DIAGNOSIS — Z7982 Long term (current) use of aspirin: Secondary | ICD-10-CM | POA: Insufficient documentation

## 2024-01-31 DIAGNOSIS — Z7984 Long term (current) use of oral hypoglycemic drugs: Secondary | ICD-10-CM | POA: Insufficient documentation

## 2024-01-31 DIAGNOSIS — S3012XA Contusion of groin, initial encounter: Secondary | ICD-10-CM | POA: Insufficient documentation

## 2024-01-31 NOTE — ED Triage Notes (Addendum)
 PT coming from home pt had procedure Monday for stents in legs for blood clot. pt reporting groin pain, swelling noticed it about 3 hrs ago. Pt also endorses abd bloating. Last bowel movement this morning. PT also expressing SOB

## 2024-02-01 ENCOUNTER — Other Ambulatory Visit: Payer: Self-pay | Admitting: *Deleted

## 2024-02-01 ENCOUNTER — Emergency Department (HOSPITAL_COMMUNITY)

## 2024-02-01 ENCOUNTER — Emergency Department (HOSPITAL_COMMUNITY): Admit: 2024-02-01 | Discharge: 2024-02-01 | Disposition: A | Attending: Vascular Surgery

## 2024-02-01 DIAGNOSIS — R1032 Left lower quadrant pain: Secondary | ICD-10-CM

## 2024-02-01 LAB — I-STAT CHEM 8, ED
BUN: 23 mg/dL (ref 8–23)
Calcium, Ion: 1.19 mmol/L (ref 1.15–1.40)
Chloride: 103 mmol/L (ref 98–111)
Creatinine, Ser: 1.5 mg/dL — ABNORMAL HIGH (ref 0.61–1.24)
Glucose, Bld: 205 mg/dL — ABNORMAL HIGH (ref 70–99)
HCT: 31 % — ABNORMAL LOW (ref 39.0–52.0)
Hemoglobin: 10.5 g/dL — ABNORMAL LOW (ref 13.0–17.0)
Potassium: 3.9 mmol/L (ref 3.5–5.1)
Sodium: 140 mmol/L (ref 135–145)
TCO2: 25 mmol/L (ref 22–32)

## 2024-02-01 LAB — TYPE AND SCREEN
ABO/RH(D): O NEG
Antibody Screen: NEGATIVE

## 2024-02-01 LAB — BASIC METABOLIC PANEL WITH GFR
Anion gap: 9 (ref 5–15)
BUN: 20 mg/dL (ref 8–23)
CO2: 25 mmol/L (ref 22–32)
Calcium: 9 mg/dL (ref 8.9–10.3)
Chloride: 104 mmol/L (ref 98–111)
Creatinine, Ser: 1.3 mg/dL — ABNORMAL HIGH (ref 0.61–1.24)
GFR, Estimated: 59 mL/min — ABNORMAL LOW (ref >60)
Glucose, Bld: 210 mg/dL — ABNORMAL HIGH (ref 70–99)
Potassium: 3.8 mmol/L (ref 3.5–5.1)
Sodium: 138 mmol/L (ref 135–145)

## 2024-02-01 LAB — ABO/RH: ABO/RH(D): O NEG

## 2024-02-01 LAB — CBC WITH DIFFERENTIAL/PLATELET
Abs Immature Granulocytes: 0.13 K/uL — ABNORMAL HIGH (ref 0.00–0.07)
Basophils Absolute: 0.1 K/uL (ref 0.0–0.1)
Basophils Relative: 1 %
Eosinophils Absolute: 0 K/uL (ref 0.0–0.5)
Eosinophils Relative: 0 %
HCT: 33.9 % — ABNORMAL LOW (ref 39.0–52.0)
Hemoglobin: 11.6 g/dL — ABNORMAL LOW (ref 13.0–17.0)
Immature Granulocytes: 2 %
Lymphocytes Relative: 11 %
Lymphs Abs: 0.8 K/uL (ref 0.7–4.0)
MCH: 31.7 pg (ref 26.0–34.0)
MCHC: 34.2 g/dL (ref 30.0–36.0)
MCV: 92.6 fL (ref 80.0–100.0)
Monocytes Absolute: 0.7 K/uL (ref 0.1–1.0)
Monocytes Relative: 10 %
Neutro Abs: 5.8 K/uL (ref 1.7–7.7)
Neutrophils Relative %: 76 %
Platelets: 254 K/uL (ref 150–400)
RBC: 3.66 MIL/uL — ABNORMAL LOW (ref 4.22–5.81)
RDW: 13.2 % (ref 11.5–15.5)
WBC: 7.4 K/uL (ref 4.0–10.5)
nRBC: 0 % (ref 0.0–0.2)

## 2024-02-01 LAB — HEMOGLOBIN AND HEMATOCRIT, BLOOD
HCT: 34.4 % — ABNORMAL LOW (ref 39.0–52.0)
Hemoglobin: 11.5 g/dL — ABNORMAL LOW (ref 13.0–17.0)

## 2024-02-01 LAB — PROTIME-INR
INR: 1 (ref 0.8–1.2)
Prothrombin Time: 13.7 s (ref 11.4–15.2)

## 2024-02-01 MED ORDER — ONDANSETRON HCL 4 MG/2ML IJ SOLN
4.0000 mg | Freq: Once | INTRAMUSCULAR | Status: AC
  Administered 2024-02-01: 4 mg via INTRAVENOUS
  Filled 2024-02-01: qty 2

## 2024-02-01 MED ORDER — MORPHINE SULFATE (PF) 4 MG/ML IV SOLN
4.0000 mg | Freq: Once | INTRAVENOUS | Status: AC
  Administered 2024-02-01: 4 mg via INTRAVENOUS
  Filled 2024-02-01: qty 1

## 2024-02-01 MED ORDER — OXYCODONE HCL 5 MG PO TABS: 5.0000 mg | ORAL_TABLET | ORAL | 0 refills | Status: AC | PRN

## 2024-02-01 MED ORDER — IOHEXOL 350 MG/ML SOLN
100.0000 mL | Freq: Once | INTRAVENOUS | Status: AC | PRN
  Administered 2024-02-01: 100 mL via INTRAVENOUS

## 2024-02-01 NOTE — Discharge Instructions (Signed)
 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   At home, please use Tylenol  for your pain.  Ice your groin.  You may also take the oxycodone  we have prescribed you for any breakthrough pain that may have.  Do not take this before driving or operating heavy machinery.  Do not take this medication with alcohol or benzodiazepines like Xanax .  Check your MyChart online for the results of any tests that had not resulted by the time you left the emergency department.   Follow-up with your primary doctor in 2-3 days regarding your visit.  Follow-up with your vascular surgeon in the next week.  Return immediately to the emergency department if you experience any of the following: Worsening pain in your groin, pain in your leg, coolness of your foot, severe pain of your foot, or any other concerning symptoms.    Thank you for visiting our Emergency Department. It was a pleasure taking care of you today.

## 2024-02-01 NOTE — ED Provider Notes (Signed)
 [place] EMERGENCY DEPARTMENT AT [facility] Provider Note   CSN: [id number] Arrival date & time: 01/31/24  2315     Patient presents with: Groin Pain   Iverson Sees is a 68 y.o. male.   HPI     This is a 68 year old male who presents with groin pain and bruising.  Patient recently underwent aortogram with bilateral lower extremities and stent placement of the left iliac for ongoing chronic claudication by Dr. Pearline.  Had been doing well.  Started Plavix  and aspirin  on Monday.  Around 3 to 4 PM this afternoon noted some increased bruising of the mons pubis and bilateral groin area which had not been present previously.  Subsequently developed some pain left greater than right and bulging noted in the left groin.  Reports a little bit of tingling in the bilateral lower extremities.  Prior to Admission medications   Medication Sig Start Date End Date Taking? Authorizing Provider  albuterol  (VENTOLIN  HFA) 108 (90 Base) MCG/ACT inhaler INHALE 1 PUFF EVERY 6 HOURS AS NEEDED 08/01/23   Caleen Dirks, MD  ALPRAZolam  (XANAX ) 1 MG tablet Take 1 tablet (1 mg total) by mouth 2 (two) times daily as needed for anxiety. 11/14/23   Amin, Saad, MD  amLODipine  (NORVASC ) 5 MG tablet Take 1 tablet (5 mg total) by mouth daily. 04/09/23 04/08/24  Amin, Saad, MD  aspirin  325 MG tablet 1 tablet (325 mg total) daily in the morning.    [provider]  atorvastatin  (LIPITOR) 80 MG tablet Take 1 tablet (80 mg total) by mouth at bedtime. 12/03/23   Amin, Saad, MD  celecoxib  (CELEBREX ) 200 MG capsule Take 1 capsule (200 mg total) by mouth daily. 08/15/23   Amin, Saad, MD  cilostazol  (PLETAL ) 100 MG tablet Take 1 tablet (100 mg total) by mouth 2 (two) times daily. 01/28/24   Pearline Norman RAMAN, MD  clopidogrel  (PLAVIX ) 75 MG tablet Take 1 tablet (75 mg total) by mouth daily. 01/28/24 04/27/24  Pearline Norman RAMAN, MD  clotrimazole -betamethasone  (LOTRISONE ) cream Apply topically 2 (two) times daily. 12/03/23    Amin, Saad, MD  divalproex  (DEPAKOTE  ER) 250 MG 24 hr tablet TAKE 1 TABLET BY MOUTH TWICE DAILY 07/31/23   Amin, Saad, MD  fenofibrate  (TRICOR ) 145 MG tablet TAKE 1 TABLET BY MOUTH DAILY. 11/01/23   Amin, Saad, MD  finasteride  (PROSCAR ) 5 MG tablet TAKE 1 TABLET BY MOUTH DAILY 02/23/23   Amin, Saad, MD  furosemide  (LASIX ) 40 MG tablet TAKE 1 TABLET BY MOUTH DAILY. 11/01/23   Amin, Saad, MD  hydrochlorothiazide (HYDRODIURIL) 25 MG tablet TAKE 1 TABLET BY MOUTH DAILY ALONG WITH LOSARTAN  11/16/23   Caleen Dirks, MD  ipratropium-albuterol  (DUONEB) 0.5-2.5 (3) MG/3ML SOLN Take 3 mLs by nebulization every 6 (six) hours as needed. 10/06/22   Amin, Saad, MD  levothyroxine  (SYNTHROID ) 75 MCG tablet TAKE 1 TABLET BY MOUTH DAILY. EXCEPT ON SUNDAYS TAKE 2 TABLETS. 11/01/23   Caleen Dirks, MD  losartan  (COZAAR ) 100 MG tablet TAKE 1 TABLET BY MOUTH DAILY ALONG WITH HCTZ 11/16/23   Amin, Saad, MD  losartan -hydrochlorothiazide (HYZAAR) 100-25 MG tablet Take 1 tablet by mouth daily. 05/09/23   Amin, Saad, MD  metFORMIN  (GLUCOPHAGE -XR) 500 MG 24 hr tablet Take 1 tablet (500 mg total) by mouth 2 (two) times daily with a meal. 09/17/23   Caleen Dirks, MD  methocarbamol (ROBAXIN) 750 MG tablet Take 1 tablet (750 mg total) by mouth every 8 (eight) hours as needed for muscle spasms. 12/14/23  Caleen Dirks, MD  nitroGLYCERIN  (NITROSTAT ) 0.4 MG SL tablet Place 1 tablet (0.4 mg total) under the tongue every 5 (five) minutes as needed for chest pain. 10/23/22   Amin, Saad, MD  nystatin  cream (MYCOSTATIN ) Apply topically 2 (two) times daily. 02/09/23   Amin, Saad, MD  Omega-3 Fatty Acids (OMEGA-3 FISH OIL) 1200 MG CAPS Take by mouth.    [provider]  omeprazole  (PRILOSEC) 40 MG capsule TAKE 1 CAPSULE DAILY BEFORE BEDTIME 11/01/23   Amin, Saad, MD  potassium chloride  SA (KLOR-CON  M) 20 MEQ tablet TAKE 1 TABLET BY MOUTH DAILY. 11/01/23   Amin, Saad, MD  predniSONE  (DELTASONE ) 10 MG tablet Take 4 tablets (40 mg total) by mouth daily with  breakfast. 12/03/23   Caleen Dirks, MD  [facility] injection  09/28/21   [provider]  tamsulosin  (FLOMAX ) 0.4 MG CAPS capsule TAKE 2 CAPSULES BY MOUTH DAILY. 11/01/23   Amin, Saad, MD    Allergies: Niacin    Review of Systems  Constitutional:  Negative for fever.  Respiratory:  Negative for shortness of breath.   Cardiovascular:  Negative for chest pain.  Musculoskeletal:        Bruising and swelling in bilateral groins  Neurological:  Negative for weakness.  All other systems reviewed and are negative.   Updated Vital Signs BP (!) 164/62   Pulse 81   Temp 98 F (36.7 C) (Oral)   Resp 19   SpO2 98%   Physical Exam Vitals and nursing note reviewed.  Constitutional:      Appearance: He is well-developed. He is not ill-appearing.  HENT:     Head: Normocephalic and atraumatic.  Eyes:     Pupils: Pupils are equal, round, and reactive to light.  Cardiovascular:     Rate and Rhythm: Normal rate and regular rhythm.  Pulmonary:     Effort: Pulmonary effort is normal. No respiratory distress.  Abdominal:     Palpations: Abdomen is soft.     Tenderness: There is no abdominal tenderness.     Comments: Ecchymosis noted bilateral groins and into the mons pubis, there is tenderness to palpation left greater than right inguinal region with a palpable pulsatile mass in the left hip region  Musculoskeletal:     Cervical back: Neck supple.     Comments: 1+ DP pulses bilaterally bilateral lower extremities  Lymphadenopathy:     Cervical: No cervical adenopathy.  Skin:    General: Skin is warm and dry.  Neurological:     Mental Status: He is alert and oriented to person, place, and time.  Psychiatric:        Mood and Affect: Mood normal.     (all labs ordered are listed, but only abnormal results are displayed) Labs Reviewed  CBC WITH DIFFERENTIAL/PLATELET - Abnormal; Notable for the following components:      Result Value   RBC 3.66 (*)    Hemoglobin 11.6 (*)    HCT 33.9  (*)    Abs Immature Granulocytes 0.13 (*)    All other components within normal limits  BASIC METABOLIC PANEL WITH GFR - Abnormal; Notable for the following components:   Glucose, Bld 210 (*)    Creatinine, Ser 1.30 (*)    GFR, Estimated 59 (*)    All other components within normal limits  I-STAT CHEM 8, ED - Abnormal; Notable for the following components:   Creatinine, Ser 1.50 (*)    Glucose, Bld 205 (*)    Hemoglobin 10.5 (*)  HCT 31.0 (*)    All other components within normal limits  PROTIME-INR  HEMOGLOBIN AND HEMATOCRIT, BLOOD  TYPE AND SCREEN  ABO/RH    EKG: None  Radiology: CT ANGIO LOWER EXT BILAT W &/OR WO CONTRAST Result Date: 02/01/2024 EXAM: CTA BILATERAL LOWER EXTREMITY 02/01/2024 02:18:26 AM TECHNIQUE: Without and with contrast-enhanced computed tomography angiography of the lower extremity was performed with multiplanar reconstructions. Maximum intensity projection images were created on a separate workstation and reviewed. Automated exposure control, iterative reconstruction, and/or weight based adjustment of the mA/kV was utilized to reduce the radiation dose to as low as reasonably achievable. COMPARISON: None available. CLINICAL HISTORY: Peripheral arterial disease (PAD), asymptomatic; recent stenting, pulsatile mass left groin. FINDINGS: ARTERIAL: **Abdominal Aorta:** Moderate atherosclerotic calcification. No aneurysm or dissection. **Celiac Axis:** Moderate compression upon the proximal celiac axis secondary to mass effect on the median nerve with a resultant less than 50% stenosis. **Superior Mesenteric Artery:** Wide patency without evidence of aneurysm or dissection. **Inferior Mesenteric Artery:** High-grade (greater than 75% stenosis) at its origin. Distally widely patent. **Left Common Iliac Artery:** Stenting with wide patency of the stented segment. **Left External Iliac Artery:** Stenting with wide patency of the stented segment. **Right Common Iliac  Artery:** Multifocal stenoses of up to 50%. **Right External Iliac Artery:** Multifocal stenoses of up to 50%. **Internal Iliac Arteries:** Occluded bilaterally at their origins with reconstitution by posterior collaterals. 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 **Right Common Femoral Artery:** 50% stenosis secondary to atheromatous plaque. **Left Superficial Femoral Artery:** Scattered atherosclerotic calcifications in the distal left superficial femoral artery resulting in short segment 50% stenosis of the superficial femoral artery at the adductor hiatus. **Right Superficial Femoral Artery:** Scattered atherosclerotic calcifications involving the distal right superficial femoral artery, with a focal 50-75% stenosis at its origin and long segment 50-75% stenosis at the adductor hiatus. **Profunda Femoral Arteries:** Widely patent bilaterally. **POPLITEAL ARTERY:** No significant stenosis or vessel occlusion. **TIBIOPERONEAL TRUNK:** No significant stenosis or vessel occlusion. **ANTERIOR TIBIAL ARTERY:** Flow is identified in the anterior tibial artery to the ankle. Flow identified in the dorsalis pedis artery. **PERONEAL ARTERY:** Flow is identified to the ankle. **POSTERIOR TIBIAL ARTERY:** No significant stenosis or vessel occlusion. Posterior tibial artery flow is present into the hindfoot. **Lower Extremity Arterial Runoff (General):** Widely patent bilaterally, demonstrating normal anatomic configuration and 3-vessel runoff to the feet bilaterally with continuation of the dorsalis pedis and plantar arch. BONES AND SOFT TISSUES: **Soft Tissues:** Extensive infiltration within the inguinal regions bilaterally superficial to the common femoral vasculature likely related to prior catheterization. **Adrenal Glands:** A slightly remote increase in size of a 2.5 cm benign adrenal adenoma involving the right adrenal gland when compared  to prior MRI examination of 04/27/2016. The left adrenal gland is unremarkable. **Kidneys:** Simple cortical cysts within the left kidney are present, and follow-up imaging is recommended. The kidneys are otherwise unremarkable. **Gastrointestinal Tract:** Severe sigmoid diverticulosis without superimposed acute inflammatory change. Rounded metallic density noted within the cecum measuring 17 mm, of unclear significance. The stomach, small bowel, and large bowel are otherwise unremarkable. **Pelvis:** A male prosthesis is in place with the reservoir noted within the pelvis anterior pelvis just above the pubic symphysis. Marked prostatic hypertrophy. **Osseous Structures:** No significant osseous abnormalities seen within the field of view. IMPRESSION: 1. 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 2.  Internal iliac arteries are occluded bilaterally at their origins with reconstitution by posterior collaterals. 3. Scattered atherosclerotic calcifications in the distal right superficial femoral artery, with a focal 50-75% stenosis at its origin and long segment 50-75% stenosis at the adductor hiatus. 4. Stenting of the left common and external iliac arteries with wide patency of the stented segment. 5. Extensive scattered atherosclerotic plaque within the right lower extremity arterial inflow with multifocal stenoses of the right common and external iliac arteries of up to 50%. 6. Scattered atherosclerotic calcifications in the distal left superficial femoral artery resulting in short segment 50% stenosis of the superficial femoral artery at the adductor hiatus. 7. 50% stenosis of the right common femoral artery secondary to atheromatous plaque. 8. Lower extremity arterial runoff bilaterally is widely patent, demonstrating normal anatomic configuration and 3-vessel runoff to the feet bilaterally with continuation of the dorsalis pedis  and plantar arch. Electronically signed by: Dorethia Molt MD 02/01/2024 02:38 AM EST RP Workstation: [id number]     Procedures   Medications Ordered in the ED  ondansetron  (ZOFRAN ) injection 4 mg (4 mg Intravenous Given 02/01/24 0116)  morphine  (PF) 4 MG/ML injection 4 mg (4 mg Intravenous Given 02/01/24 0115)  iohexol  (OMNIPAQUE ) 350 MG/ML injection 100 mL (100 mLs Intravenous Contrast Given 02/01/24 0218)    Clinical Course as of 02/01/24 0446  Fri Feb 01, 2024  0309 Spoke with vascular surgery Amos) to follow-up regarding pseudoaneurysm noted on CTA.  Relatively small.  Plan to repeat H&H and have vascular surgery evaluate patient in the morning.  May arrange for ultrasound-guided compression [CH]  0355 Overall patient feels improved.  He feels like the swelling has gone down.  I agree.  Palpable pulsatile mass appears smaller but is still present.  Would recommend that he stay for vascular evaluation to ensure that he does not need any intervention.   [CH]    Clinical Course User Index [CH] Brianda Beitler, Charmaine FALCON, MD                                 Medical Decision Making Amount and/or Complexity of Data Reviewed Labs: ordered. Radiology: ordered.  Risk Prescription drug management.   This patient presents to the ED for concern of pain, swelling in the groin, this involves an extensive number of treatment options, and is a complaint that carries with it a high risk of complications and morbidity.  I considered the following differential and admission for this acute, potentially life threatening condition.  The differential diagnosis includes hematoma, aneurysm at puncture site, other complication  MDM:    This is a 68 year old male with recent aortogram and stent placement in the left common femoral who presents with pain and swelling in the groin.  He has additional ecchymosis.  He has a pulsatile swelling in the left inguinal region.  He is nontoxic.  Hemodynamically stable.   Discussed with vascular regarding imaging.  Will obtain CTA bilateral lower extremities after conferring with radiation technologist who states that this would include the lower abdominal aorta.  Hemoglobin is 11.6.  It has dropped approximately 2 g since surgery.  No hemodynamic instability.  CT is concerning for small pseudoaneurysm.  See clinical course above.  (Labs, imaging, consults)  Labs: I Ordered, and personally interpreted labs.  The pertinent results include: CBC, BMP  Imaging Studies ordered: I ordered imaging studies including CTA I independently visualized and interpreted imaging. I agree with the  radiologist interpretation  Additional history obtained from chart review.  External records from outside source obtained and reviewed including op notes  Cardiac Monitoring: The patient was maintained on a cardiac monitor.  If on the cardiac monitor, I personally viewed and interpreted the cardiac monitored which showed an underlying rhythm of: Sinus  Reevaluation: After the interventions noted above, I reevaluated the patient and found that they have :improved  Social Determinants of Health:  lives independently  Disposition: Vascular surgery consultation and anticipate discharge  Co morbidities that complicate the patient evaluation  Past Medical History:  Diagnosis Date   Diabetes mellitus without complication (HCC)    Hyperlipidemia    Hypertension    Thyroid  disease      Medicines Meds ordered this encounter  Medications   ondansetron  (ZOFRAN ) injection 4 mg   morphine  (PF) 4 MG/ML injection 4 mg   iohexol  (OMNIPAQUE ) 350 MG/ML injection 100 mL    I have reviewed the patients home medicines and have made adjustments as needed  Problem List / ED Course: Problem List Items Addressed This Visit   None Visit Diagnoses       Pseudoaneurysm    -  Primary                Final diagnoses:  Pseudoaneurysm    ED Discharge Orders     None           Laroy Mustard, Charmaine FALCON, MD 02/01/24 [phone]

## 2024-02-01 NOTE — Consult Note (Signed)
 [facility] [place]  ASSESSMENT / PLAN: 68 y.o. 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 Plan ultrasound guided compression today to try to resolve the aneurysm. OK for discharge after compression if pseudoaneurysm thrombosed.   CHIEF COMPLAINT: Groin bruising and pain  HISTORY OF PRESENT ILLNESS: Bryan Mccormick is a 68 y.o. male who underwent percutaneous intervention for lifestyle-limiting claudication and rest pain, he underwent left common iliac artery stenting and drug-coated angioplasty of an existing left external iliac artery stent.  Bilateral common femoral artery access was obtained and closed with Mynx devices.  The patient reported bruising and discomfort in both groins in the days following the procedure.  He presented to care late last night for evaluation.  He is found to have a palpable pulsatile mass in the left groin.  CT angiogram was performed which confirmed a small pseudoaneurysm in the left groin.  Patient is mildly symptomatic from this on my evaluation.  I counseled him about options available for treatment, and we both prefer to noninvasive treatment if at all possible.  Past Medical History:  Diagnosis Date   Diabetes mellitus without complication (HCC)    Hyperlipidemia    Hypertension    Thyroid  disease     Past Surgical History:  Procedure Laterality Date   ABDOMINAL AORTOGRAM W/LOWER EXTREMITY N/A 01/28/2024   Procedure: ABDOMINAL AORTOGRAM W/LOWER EXTREMITY;  Surgeon: Pearline Norman RAMAN, MD;  Location: [facility] INVASIVE CV LAB;  Service: Cardiovascular;  Laterality: N/A;   HYDROCELE EXCISION / REPAIR Bilateral    LOWER EXTREMITY INTERVENTION N/A 01/28/2024   Procedure: LOWER EXTREMITY INTERVENTION;  Surgeon: Pearline Norman RAMAN, MD;  Location: [facility] INVASIVE CV LAB;  Service: Cardiovascular;  Laterality: N/A;   PROSTATE SURGERY     prosthetic implants penis     TONSILLECTOMY AND ADENOIDECTOMY      History  reviewed. No pertinent family history.  Social History   Socioeconomic History   Marital status: Married    Spouse name: Not on file   Number of children: Not on file   Years of education: Not on file   Highest education level: Not on file  Occupational History   Not on file  Tobacco Use   Smoking status: Former    Current packs/day: 0.50    Types: Cigarettes   Smokeless tobacco: Current    Types: Chew   Tobacco comments:    Sometimes chew   Vaping Use   Vaping status: Never Used  Substance and Sexual Activity   Alcohol use: Never   Drug use: Never   Sexual activity: Not on file  Other Topics Concern   Not on file  Social History Narrative   Not on file   Social Drivers of Health   Financial Resource Strain: Not on file  Food Insecurity: Not on file  Transportation Needs: Not on file  Physical Activity: Not on file  Stress: Not on file  Social Connections: Not on file  Intimate Partner Violence: Not on file    Allergies  Allergen Reactions   Niacin Itching    No current facility-administered medications for this encounter.   Current Outpatient Medications  Medication Sig Dispense Refill   albuterol  (VENTOLIN  HFA) 108 (90 Base) MCG/ACT inhaler INHALE 1 PUFF EVERY 6 HOURS AS NEEDED 8.5 g 0   ALPRAZolam  (XANAX ) 1 MG tablet Take 1 tablet (1 mg total) by mouth 2 (two) times daily as needed for anxiety. 60 tablet 2   amLODipine  (NORVASC ) 5  MG tablet Take 1 tablet (5 mg total) by mouth daily. 90 tablet 3   aspirin  325 MG tablet 1 tablet (325 mg total) daily in the morning.     atorvastatin  (LIPITOR) 80 MG tablet Take 1 tablet (80 mg total) by mouth at bedtime. 90 tablet 0   celecoxib  (CELEBREX ) 200 MG capsule Take 1 capsule (200 mg total) by mouth daily. 30 capsule 2   cilostazol  (PLETAL ) 100 MG tablet Take 1 tablet (100 mg total) by mouth 2 (two) times daily. 180 tablet 0   clopidogrel  (PLAVIX ) 75 MG tablet Take 1 tablet (75 mg total) by mouth daily. 90 tablet 0    clotrimazole -betamethasone  (LOTRISONE ) cream Apply topically 2 (two) times daily. 30 g 3   divalproex  (DEPAKOTE  ER) 250 MG 24 hr tablet TAKE 1 TABLET BY MOUTH TWICE DAILY 180 tablet 1   fenofibrate  (TRICOR ) 145 MG tablet TAKE 1 TABLET BY MOUTH DAILY. 90 tablet 0   finasteride  (PROSCAR ) 5 MG tablet TAKE 1 TABLET BY MOUTH DAILY 90 tablet 2   furosemide  (LASIX ) 40 MG tablet TAKE 1 TABLET BY MOUTH DAILY. 90 tablet 0   hydrochlorothiazide (HYDRODIURIL) 25 MG tablet TAKE 1 TABLET BY MOUTH DAILY ALONG WITH LOSARTAN  30 tablet 5   ipratropium-albuterol  (DUONEB) 0.5-2.5 (3) MG/3ML SOLN Take 3 mLs by nebulization every 6 (six) hours as needed. 360 mL 1   levothyroxine  (SYNTHROID ) 75 MCG tablet TAKE 1 TABLET BY MOUTH DAILY. EXCEPT ON SUNDAYS TAKE 2 TABLETS. 96 tablet 0   losartan  (COZAAR ) 100 MG tablet TAKE 1 TABLET BY MOUTH DAILY ALONG WITH HCTZ 30 tablet 5   losartan -hydrochlorothiazide (HYZAAR) 100-25 MG tablet Take 1 tablet by mouth daily. 30 tablet 6   metFORMIN  (GLUCOPHAGE -XR) 500 MG 24 hr tablet Take 1 tablet (500 mg total) by mouth 2 (two) times daily with a meal. 180 tablet 1   methocarbamol (ROBAXIN) 750 MG tablet Take 1 tablet (750 mg total) by mouth every 8 (eight) hours as needed for muscle spasms. 60 tablet 1   nitroGLYCERIN  (NITROSTAT ) 0.4 MG SL tablet Place 1 tablet (0.4 mg total) under the tongue every 5 (five) minutes as needed for chest pain. 20 tablet 6   nystatin  cream (MYCOSTATIN ) Apply topically 2 (two) times daily. 30 g 1   Omega-3 Fatty Acids (OMEGA-3 FISH OIL) 1200 MG CAPS Take by mouth.     omeprazole  (PRILOSEC) 40 MG capsule TAKE 1 CAPSULE DAILY BEFORE BEDTIME 90 capsule 0   potassium chloride  SA (KLOR-CON  M) 20 MEQ tablet TAKE 1 TABLET BY MOUTH DAILY. 90 tablet 0   predniSONE  (DELTASONE ) 10 MG tablet Take 4 tablets (40 mg total) by mouth daily with breakfast. 25 tablet 0   SHINGRIX injection      tamsulosin  (FLOMAX ) 0.4 MG CAPS capsule TAKE 2 CAPSULES BY MOUTH DAILY. 180 capsule 0     PHYSICAL EXAM Vitals:   01/31/24 2327 01/31/24 2331 02/01/24 0700 02/01/24 0743  BP: (!) 164/62  (!) 147/66   Pulse: 81  (!) 59   Resp: 19  18   Temp: 98 F (36.7 C)   97.6 F (36.4 C)  TempSrc: Oral   Oral  SpO2: 98% 98% 95%    No acute distress Regular rate and rhythm Unlabored breathing Ecchymosis of bilateral groins Small palpable pulsatile mass in the left groin  PERTINENT LABORATORY AND RADIOLOGIC DATA  Most recent CBC    Latest Ref Rng & Units 02/01/2024    3:10 AM 02/01/2024   12:04 AM 01/31/2024  11:50 PM  CBC  WBC 4.0 - 10.5 K/uL   7.4   Hemoglobin 13.0 - 17.0 g/dL 88.4  89.4  88.3   Hematocrit 39.0 - 52.0 % 34.4  31.0  33.9   Platelets 150 - 400 K/uL   254      Most recent CMP    Latest Ref Rng & Units 02/01/2024   12:04 AM 01/31/2024   11:50 PM 01/28/2024    7:45 AM  CMP  Glucose 70 - 99 mg/dL 794  789  871   BUN 8 - 23 mg/dL 23  20  34   Creatinine 0.61 - 1.24 mg/dL 8.49  8.69  8.39   Sodium 135 - 145 mmol/L 140  138  136   Potassium 3.5 - 5.1 mmol/L 3.9  3.8  3.3   Chloride 98 - 111 mmol/L 103  104  97   CO2 22 - 32 mmol/L  25    Calcium  8.9 - 10.3 mg/dL  9.0      Renal function Estimated Creatinine Clearance: 50.8 mL/min (A) (by C-G formula based on SCr of 1.5 mg/dL (H)).  Hgb A1c MFr Bld (%)  Date Value  12/14/2023 7.1 (H)    LDL Chol Calc (NIH)  Date Value Ref Range Status  02/09/2023 74 0 - 99 mg/dL Final     Debby SAILOR. Magda, MD FACS [facility] Phone Number: [phone] 02/01/2024 9:00 AM   Total time spent on preparing this encounter including chart review, data review, collecting history, examining the patient, and coordinating care: 45 minutes  Portions of this report may have been transcribed using voice recognition software.  Every effort has been made to ensure accuracy; however, inadvertent computerized transcription errors may still be present.

## 2024-02-01 NOTE — Progress Notes (Signed)
 PSA not identified during US  guided compression. Suspect it has thrombosed. Plan short interval follow up with Dr. Pearline. OK for discharge.  Bryan Mccormick. Magda, MD [facility] Phone Number: [phone] 02/01/2024 12:16 PM

## 2024-02-01 NOTE — Progress Notes (Signed)
 Left groin pseudoaneurysm evaluation has been completed. Preliminary results can be found in CV Proc through chart review.   02/01/24 11:53 AM [name] RVT

## 2024-02-01 NOTE — ED Provider Notes (Signed)
  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  Relatively small.  Plan to repeat H&H and have vascular surgery evaluate patient in the morning.  May arrange for ultrasound-guided compression [CH]  0355 Overall patient feels improved.  He feels like the swelling has gone down.  I agree.  Palpable pulsatile mass appears smaller but is still present.  Would recommend that he stay for vascular evaluation to ensure that he does not need any intervention.   [CH]  0727 Assumed care from Dr Bari. 68 yo M with hx of PAD who had a femoral stent placed on 11/31 who presented with pain and bruising to the L groin. CTA shows a pseudoaneurysm. Dr Lindajean from vascular surgery will come see the pt. Did have a drop in his hgb on his first CBC but repeat HGB has not dropped any further.  [RP]  1017 Per Dr Magda plan to ultrasound guided compression today to try to resolve the aneurysm. OK for discharge after compression if pseudoaneurysm thrombosed.  [RP]  1220 Ultrasound was unable to visualize the aneurysm.  Patient reassessed and pain is under control.  Still has good pulses in his foot.  Did discuss this with Dr. Magda who says that he was at the bedside when they were performing ultrasound.  He believes that the pseudoaneurysm is thrombosed.  Feels that the patient can be discharged at this point in time and follow-up with Dr. Pearline in clinic.  Will give the patient short course of pain medication [RP]    Clinical Course User Index [CH] Horton, Charmaine FALCON, MD [RP] Yolande Lamar BROCKS, MD   Medical Decision Making Amount and/or Complexity of Data Reviewed Labs: ordered. Radiology: ordered.  Risk Prescription drug management.        Yolande Lamar BROCKS, MD 02/01/24 [phone]

## 2024-02-04 ENCOUNTER — Other Ambulatory Visit: Payer: Self-pay | Admitting: Internal Medicine

## 2024-02-05 ENCOUNTER — Encounter: Payer: Self-pay | Admitting: Internal Medicine

## 2024-02-05 ENCOUNTER — Ambulatory Visit: Admitting: Internal Medicine

## 2024-02-05 VITALS — BP 128/56 | HR 74 | Temp 98.1°F | Resp 16 | Ht 68.0 in | Wt 205.6 lb

## 2024-02-05 DIAGNOSIS — D500 Iron deficiency anemia secondary to blood loss (chronic): Secondary | ICD-10-CM | POA: Insufficient documentation

## 2024-02-05 DIAGNOSIS — K469 Unspecified abdominal hernia without obstruction or gangrene: Secondary | ICD-10-CM | POA: Insufficient documentation

## 2024-02-05 DIAGNOSIS — S00531A Contusion of lip, initial encounter: Secondary | ICD-10-CM | POA: Insufficient documentation

## 2024-02-05 DIAGNOSIS — I739 Peripheral vascular disease, unspecified: Secondary | ICD-10-CM

## 2024-02-05 MED ORDER — ALPRAZOLAM 1 MG PO TABS: 1.0000 mg | ORAL_TABLET | Freq: Two times a day (BID) | ORAL | 2 refills | Status: AC | PRN

## 2024-02-05 NOTE — Assessment & Plan Note (Signed)
 He has bruise in his groin and upper thigh but it is better now.  I will do CBC for blood loss anemia.

## 2024-02-05 NOTE — Assessment & Plan Note (Signed)
 He has a left lateral abdominal wall hernia and will continue to monitor.

## 2024-02-05 NOTE — Addendum Note (Signed)
 Addended by: Carmeline Kowal on: 02/05/2024 03:24 PM   Modules accepted: Orders

## 2024-02-05 NOTE — Assessment & Plan Note (Signed)
 He has a balloon angioplasty done on right iliac artery and stent was placed on left iliac artery on December 1st.  He is taking aspirin  and Plavix  and he will follow-up with vascular surgeon on Friday.

## 2024-02-05 NOTE — Progress Notes (Addendum)
 Office Visit  Subjective   Patient ID: Bryan Mccormick   DOB: Apr 25, 1955   Age: 68 y.o.   MRN: [id number]   Chief Complaint Chief Complaint  Patient presents with   Hospitalization Follow-up    Pt in today for  hospital follow up. The patient reports he went to [facility] around Friday night io 02/01/2024 for an Aneurism.      History of Present Illness 68 years old male who has aortogram and stent placed left iliac artery on 01/28/24 and he was discharge home on the same day on plavix  and aspirin . He noted bruise in his groin and upper thighs and he went to ED on 01/31/24 and he was told that he may have pseudoaneurysm but ultrasound showed no aneurysm. He was advised to follow up with vascular surgeon on Friday.  He has CBC done in the emergency room for blood loss anemia and it was very little dropped and he was advised to have follow-up with me.  He says that his bruising is getting better.    He also is complaining of elevated blood pressure.  He takes 3 blood pressure medications and his blood pressure is well controlled today.  He says that sometime at home his blood pressure is elevated.    He also noted swelling on his left lateral abdomen when he stand up.  This is going on for few months.     Past Medical History Past Medical History:  Diagnosis Date   Diabetes mellitus without complication (HCC)    Hyperlipidemia    Hypertension    Thyroid  disease      Allergies Allergies  Allergen Reactions   Niacin Itching     Review of Systems Review of Systems  Constitutional: Negative.   Respiratory: Negative.    Cardiovascular: Negative.   Gastrointestinal:        Swelling left lateral abdomen wall  Neurological: Negative.        Objective:    Vitals BP (!) 128/56   Pulse 74   Temp 98.1 F (36.7 C) (Temporal)   Resp 16   Ht 5' 8 (1.727 m)   Wt 205 lb 9.6 oz (93.3 kg)   SpO2 97%   BMI 31.26 kg/m    Physical Examination Physical Exam Constitutional:       Appearance: Normal appearance.  HENT:     Head: Normocephalic and atraumatic.  Cardiovascular:     Rate and Rhythm: Normal rate and regular rhythm.     Heart sounds: Normal heart sounds.  Pulmonary:     Effort: Pulmonary effort is normal.  Abdominal:     General: Bowel sounds are normal.     Palpations: Abdomen is soft.     Comments:   Left lateral abdomen protruded outside.  It is soft and reducible  Neurological:     General: No focal deficit present.     Mental Status: He is alert and oriented to person, place, and time.        Assessment & Plan:   PAD (peripheral artery disease)  He has a balloon angioplasty done on right iliac artery and stent was placed on left iliac artery on December 1st.  He is taking aspirin  and Plavix  and he will follow-up with vascular surgeon on Friday.  Bruise of upper lip   He has bruise in his groin and upper thigh but it is better now.  I will do CBC for blood loss anemia.  Secondary hernia of abdomen  He has a left lateral abdominal wall hernia and will continue to monitor.  Blood loss anemia   I will do CBC today     Follow-up with regular appointment.  Roetta Dare, MD

## 2024-02-05 NOTE — Addendum Note (Signed)
 Addended by: Aerial Dilley on: 02/05/2024 03:54 PM   Modules accepted: Level of Service

## 2024-02-05 NOTE — Assessment & Plan Note (Signed)
I will do CBC today.

## 2024-02-06 ENCOUNTER — Ambulatory Visit: Payer: Self-pay

## 2024-02-06 LAB — CBC WITH DIFFERENTIAL/PLATELET
Basophils Absolute: 0.1 x10E3/uL (ref 0.0–0.2)
Basos: 1 %
EOS (ABSOLUTE): 0 x10E3/uL (ref 0.0–0.4)
Eos: 0 %
Hematocrit: 38.1 % (ref 37.5–51.0)
Hemoglobin: 12.8 g/dL — ABNORMAL LOW (ref 13.0–17.7)
Immature Grans (Abs): 0.1 x10E3/uL (ref 0.0–0.1)
Immature Granulocytes: 2 %
Lymphocytes Absolute: 1.3 x10E3/uL (ref 0.7–3.1)
Lymphs: 19 %
MCH: 31.3 pg (ref 26.6–33.0)
MCHC: 33.6 g/dL (ref 31.5–35.7)
MCV: 93 fL (ref 79–97)
Monocytes Absolute: 0.7 x10E3/uL (ref 0.1–0.9)
Monocytes: 10 %
Neutrophils Absolute: 4.8 x10E3/uL (ref 1.4–7.0)
Neutrophils: 68 %
Platelets: 383 x10E3/uL (ref 150–450)
RBC: 4.09 x10E6/uL — ABNORMAL LOW (ref 4.14–5.80)
RDW: 13 % (ref 11.6–15.4)
WBC: 6.9 x10E3/uL (ref 3.4–10.8)

## 2024-02-06 NOTE — Progress Notes (Signed)
Patient called.  Left message for patient to call back.

## 2024-02-06 NOTE — Progress Notes (Signed)
 Patient called.  Patient aware. His hemoglobin is better

## 2024-02-07 NOTE — Progress Notes (Unsigned)
 Patient ID: Bryan Mccormick, male   DOB: January 22, 1956, 68 y.o.   MRN: [id number]  Reason for Consult: No chief complaint on file.   Referred by Caleen Dirks, MD  Subjective:     HPI Bryan Mccormick is a 68 y.o. male 69 year old male who recently underwent angiogram with balloon angioplasty and stenting of the left common iliac artery and drug-coated balloon angioplasty of the previously placed left external iliac stent on 01/28/2024.  On 5 December he came to the ED for bruising and discomfort.  A CTA demonstrated a small 7 mm pseudoaneurysm.  Today he presents for follow-up. ***  Past Medical History:  Diagnosis Date   Diabetes mellitus without complication (HCC)    Hyperlipidemia    Hypertension    Thyroid  disease    No family history on file. Past Surgical History:  Procedure Laterality Date   ABDOMINAL AORTOGRAM W/LOWER EXTREMITY N/A 01/28/2024   Procedure: ABDOMINAL AORTOGRAM W/LOWER EXTREMITY;  Surgeon: Pearline Norman RAMAN, MD;  Location: [facility] INVASIVE CV LAB;  Service: Cardiovascular;  Laterality: N/A;   HYDROCELE EXCISION / REPAIR Bilateral    LOWER EXTREMITY INTERVENTION N/A 01/28/2024   Procedure: LOWER EXTREMITY INTERVENTION;  Surgeon: Pearline Norman RAMAN, MD;  Location: [facility] INVASIVE CV LAB;  Service: Cardiovascular;  Laterality: N/A;   PROSTATE SURGERY     prosthetic implants penis     TONSILLECTOMY AND ADENOIDECTOMY      Short Social History:  Social History   Tobacco Use   Smoking status: Former    Current packs/day: 0.50    Types: Cigarettes   Smokeless tobacco: Current    Types: Chew   Tobacco comments:    Sometimes chew   Substance Use Topics   Alcohol use: Never    Allergies[1]  Current Outpatient Medications  Medication Sig Dispense Refill   albuterol  (VENTOLIN  HFA) 108 (90 Base) MCG/ACT inhaler INHALE 1 PUFF EVERY 6 HOURS AS NEEDED 8.5 g 0   ALPRAZolam  (XANAX ) 1 MG tablet Take 1 tablet (1 mg total) by mouth 2 (two) times daily as needed for anxiety. 60 tablet 2    amLODipine  (NORVASC ) 5 MG tablet Take 1 tablet (5 mg total) by mouth daily. 90 tablet 3   aspirin  325 MG tablet 1 tablet (325 mg total) daily in the morning.     atorvastatin  (LIPITOR) 80 MG tablet Take 1 tablet (80 mg total) by mouth at bedtime. 90 tablet 0   celecoxib  (CELEBREX ) 200 MG capsule Take 1 capsule (200 mg total) by mouth daily. 30 capsule 2   cilostazol  (PLETAL ) 100 MG tablet Take 1 tablet (100 mg total) by mouth 2 (two) times daily. 180 tablet 0   clopidogrel  (PLAVIX ) 75 MG tablet Take 1 tablet (75 mg total) by mouth daily. 90 tablet 0   clotrimazole -betamethasone  (LOTRISONE ) cream Apply topically 2 (two) times daily. 30 g 3   divalproex  (DEPAKOTE  ER) 250 MG 24 hr tablet TAKE 1 TABLET BY MOUTH TWICE DAILY 180 tablet 1   fenofibrate  (TRICOR ) 145 MG tablet TAKE 1 TABLET BY MOUTH DAILY. 90 tablet 0   finasteride  (PROSCAR ) 5 MG tablet TAKE 1 TABLET BY MOUTH DAILY 90 tablet 2   furosemide  (LASIX ) 40 MG tablet TAKE 1 TABLET BY MOUTH DAILY. 90 tablet 0   hydrochlorothiazide (HYDRODIURIL) 25 MG tablet TAKE 1 TABLET BY MOUTH DAILY ALONG WITH LOSARTAN  30 tablet 5   ipratropium-albuterol  (DUONEB) 0.5-2.5 (3) MG/3ML SOLN Take 3 mLs by nebulization every 6 (six) hours as needed. 360 mL 1  levothyroxine  (SYNTHROID ) 75 MCG tablet TAKE 1 TABLET BY MOUTH DAILY. EXCEPT ON SUNDAYS TAKE 2 TABLETS. 96 tablet 0   losartan  (COZAAR ) 100 MG tablet TAKE 1 TABLET BY MOUTH DAILY ALONG WITH HCTZ 30 tablet 5   metFORMIN  (GLUCOPHAGE -XR) 500 MG 24 hr tablet TAKE 1 TABLET BY MOUTH TWO TIMES DAILY WITH A MEAL. 180 tablet 0   methocarbamol (ROBAXIN) 750 MG tablet Take 1 tablet (750 mg total) by mouth every 8 (eight) hours as needed for muscle spasms. 60 tablet 1   nitroGLYCERIN  (NITROSTAT ) 0.4 MG SL tablet Place 1 tablet (0.4 mg total) under the tongue every 5 (five) minutes as needed for chest pain. 20 tablet 6   nystatin  cream (MYCOSTATIN ) Apply topically 2 (two) times daily. 30 g 1   Omega-3 Fatty Acids (OMEGA-3  FISH OIL) 1200 MG CAPS Take by mouth.     omeprazole  (PRILOSEC) 40 MG capsule TAKE 1 CAPSULE DAILY BEFORE BEDTIME 90 capsule 0   oxyCODONE  (ROXICODONE ) 5 MG immediate release tablet Take 1 tablet (5 mg total) by mouth every 4 (four) hours as needed for severe pain (pain score 7-10). 12 tablet 0   potassium chloride  SA (KLOR-CON  M) 20 MEQ tablet TAKE 1 TABLET BY MOUTH DAILY. 90 tablet 0   tamsulosin  (FLOMAX ) 0.4 MG CAPS capsule TAKE 2 CAPSULES BY MOUTH DAILY. 180 capsule 0   No current facility-administered medications for this visit.    REVIEW OF SYSTEMS  All other systems were reviewed and are negative     Objective:  Objective   There were no vitals filed for this visit. There is no height or weight on file to calculate BMI.  Physical Exam General: no acute distress Cardiac: hemodynamically stable Abdomen: non-tender, no pulsatile mass*** Extremities: no edema, cyanosis or wounds*** Vascular:   Right: ***  Left: ***  Data: Groin duplex ***     Assessment/Plan:   Bryan Mccormick is a 68 y.o. male with PAD who recently underwent repeat stenting of his left common iliac artery and drug-coated balloon angioplasty of his left external iliac stent on 01/28/2024.  He was found to have a small 7 mm pseudoaneurysm on 02/01/2024 and today the duplex demonstrates ***  Norman GORMAN Serve MD [facility]     [1]  Allergies Allergen Reactions   Niacin Itching

## 2024-02-08 ENCOUNTER — Encounter: Payer: Self-pay | Admitting: Vascular Surgery

## 2024-02-08 ENCOUNTER — Ambulatory Visit (HOSPITAL_COMMUNITY): Admission: RE | Admit: 2024-02-08 | Discharge: 2024-02-08 | Attending: Surgery | Admitting: Surgery

## 2024-02-08 ENCOUNTER — Ambulatory Visit: Admitting: Vascular Surgery

## 2024-02-08 VITALS — BP 146/67 | HR 71 | Temp 98.0°F | Resp 18 | Ht 68.0 in | Wt 205.9 lb

## 2024-02-08 DIAGNOSIS — R1032 Left lower quadrant pain: Secondary | ICD-10-CM | POA: Diagnosis not present

## 2024-02-08 DIAGNOSIS — I739 Peripheral vascular disease, unspecified: Secondary | ICD-10-CM | POA: Diagnosis not present

## 2024-02-11 ENCOUNTER — Other Ambulatory Visit: Payer: Self-pay | Admitting: Internal Medicine

## 2024-02-11 ENCOUNTER — Other Ambulatory Visit: Payer: Self-pay

## 2024-02-11 DIAGNOSIS — I739 Peripheral vascular disease, unspecified: Secondary | ICD-10-CM

## 2024-02-12 ENCOUNTER — Other Ambulatory Visit: Payer: Self-pay

## 2024-02-12 MED ORDER — METOPROLOL TARTRATE 25 MG PO TABS: 25.0000 mg | ORAL_TABLET | Freq: Once | ORAL | 1 refills | Status: DC

## 2024-02-13 ENCOUNTER — Ambulatory Visit: Payer: Self-pay | Admitting: Internal Medicine

## 2024-02-13 VITALS — BP 122/60 | HR 64 | Temp 98.0°F | Resp 18 | Wt 204.3 lb

## 2024-02-13 DIAGNOSIS — E1169 Type 2 diabetes mellitus with other specified complication: Secondary | ICD-10-CM | POA: Diagnosis not present

## 2024-02-13 DIAGNOSIS — I10 Essential (primary) hypertension: Secondary | ICD-10-CM

## 2024-02-13 DIAGNOSIS — J449 Chronic obstructive pulmonary disease, unspecified: Secondary | ICD-10-CM | POA: Diagnosis not present

## 2024-02-13 DIAGNOSIS — I69354 Hemiplegia and hemiparesis following cerebral infarction affecting left non-dominant side: Secondary | ICD-10-CM | POA: Diagnosis not present

## 2024-02-13 DIAGNOSIS — E039 Hypothyroidism, unspecified: Secondary | ICD-10-CM

## 2024-02-13 DIAGNOSIS — G4733 Obstructive sleep apnea (adult) (pediatric): Secondary | ICD-10-CM | POA: Diagnosis not present

## 2024-02-13 DIAGNOSIS — F3178 Bipolar disorder, in full remission, most recent episode mixed: Secondary | ICD-10-CM | POA: Diagnosis not present

## 2024-02-13 DIAGNOSIS — R972 Elevated prostate specific antigen [PSA]: Secondary | ICD-10-CM

## 2024-02-13 DIAGNOSIS — E785 Hyperlipidemia, unspecified: Secondary | ICD-10-CM | POA: Diagnosis not present

## 2024-02-13 DIAGNOSIS — I739 Peripheral vascular disease, unspecified: Secondary | ICD-10-CM

## 2024-02-13 DIAGNOSIS — N400 Enlarged prostate without lower urinary tract symptoms: Secondary | ICD-10-CM | POA: Diagnosis not present

## 2024-02-13 DIAGNOSIS — Z0000 Encounter for general adult medical examination without abnormal findings: Secondary | ICD-10-CM

## 2024-02-13 MED ORDER — METOPROLOL TARTRATE 25 MG PO TABS: 25.0000 mg | ORAL_TABLET | Freq: Once | ORAL | 1 refills | Status: AC

## 2024-02-13 NOTE — Assessment & Plan Note (Signed)
 He has recent stent placed in his left iliac artery and balloon angioplasty in his right artery.  He will continue to follow with vascular surgeon.

## 2024-02-13 NOTE — Assessment & Plan Note (Signed)
 He has obstructive sleep apnea but he has not have titration part done.

## 2024-02-13 NOTE — Assessment & Plan Note (Signed)
His blood pressure is well-controlled 

## 2024-02-13 NOTE — Progress Notes (Signed)
 "  Office Visit  Subjective   Patient ID: Bryan Mccormick   DOB: 02-28-1955   Age: 68 y.o.   MRN: [id number]   Chief Complaint No chief complaint on file.    History of Present Illness 68 years old male is here for annual physical examination. He lives at home and somebody comes to help him, he can not tie his shoes, he get SOB as well. He sometimes help in dressing as well. He has PAD s/p stent placement 2 weeks ago. He has slight weakness left side from previous stroke but is managing himself and look after his wife at home. He does not smoke and quit smoking 9 years ago. He does not drink alcohol.    He drive, he denies any fall within last 1 year. He has flu shot this year, he has shingle vaccine and has pneumonia vaccine 10/2020. He need RSV and tetanus shot was last year.   He has colonoscopy done 01/2020 by Dr. Larene and one polyp was removed, his next will be in 2026. He has BPH and follows with [facility].   He score 30/30 on MMSE.    He has arthritis and his hip pain is better after shot was given by Dr. Renato this year. He also has chronic low back pain that does  not radiate.   He has hypertension. The patient was enrolled in CCM but he has not received BP apparatus yet. He will be checking his blood pressure at home. The patient's current medications include: AMLODIPINE  5 MG, LOSARTAN /hydrochlorothiazide 50-12.5 mg daily, and potassium chloride  ER 20 mEq tablet,extended release(part/cryst). The patient has been tolerating his medications well. The patient denies any chest pain, shortness of breath, orthopnea, and PND. He reports there have been no other symptoms noted.   The patient is a 68 year old Caucasian/White male who has mild diabetes with last HbA1c was 7.1% in 12/04/23 His last eye examination was 4 months ago at [facility]. New prescription was given but he has not filled the prescription.      The patient's past medical history is: Asthma, Cerebrovascular Accident  (CVA), Chronic Obstructive Pulmonary Disease, Hyperlipidemia, Hypertension, and DM. There is no history of foot ulcers and pancreatitis. He still has some weakness in his left side from previous stroke.   He has hypothyroidism and TSH was 2.4 on 12/14/23.   Sim BRAVO. Bryan Mccormick today for routine followup on his cholesterol. Overall, he states he is doing well and is without any complaints or problems at this time. He specifically denies chest pain, abdominal pain, nausea, diarrhea, and myalgias. He remains on dietary management as well as the following cholesterol lowering medications ATORVASTATIN  80MG  and FENOFIBRATE  145MG .  He is fasting today for lab drawn.       He has PVD with stent placement in left leg 15 years ago in [facility]. We did ABI and right leg 0.85 and left leg where he has stent placed was 1.0. He can not walk one block but has to stop because of pain.     He says that his COPD is stable, he use ProAir  and sometime use nebulizer treatment.     He was diagnosed with bipolar disorder and his symptoms are controlled.     Past Medical History Past Medical History:  Diagnosis Date   Diabetes mellitus without complication (HCC)    Hyperlipidemia    Hypertension    Peripheral vascular disease    Thyroid  disease  Allergies Allergies[1]   Review of Systems Review of Systems  Constitutional: Negative.   Respiratory: Negative.    Cardiovascular: Negative.   Gastrointestinal: Negative.   Neurological: Negative.        Objective:    Vitals BP 122/60   Pulse 64   Temp 98 F (36.7 C)   Resp 18   Wt 204 lb 5 oz (92.7 kg)   SpO2 95%   BMI 31.07 kg/m    Physical Examination Physical Exam Constitutional:      Appearance: Normal appearance.  HENT:     Head: Normocephalic and atraumatic.  Cardiovascular:     Rate and Rhythm: Normal rate and regular rhythm.     Heart sounds: Normal heart sounds.  Pulmonary:     Effort: Pulmonary effort is normal.      Breath sounds: Normal breath sounds.  Abdominal:     Palpations: Abdomen is soft.  Neurological:     General: No focal deficit present.     Mental Status: He is alert and oriented to person, place, and time.        Assessment & Plan:   PAD (peripheral artery disease)   He has recent stent placed in his left iliac artery and balloon angioplasty in his right artery.  He will continue to follow with vascular surgeon.  Hypertension   His blood pressure is well controlled.  OSA (obstructive sleep apnea)   He has obstructive sleep apnea but he has not have titration part done.  COPD (chronic obstructive pulmonary disease) (HCC)   He says that his shortness of breath is better and he is not seeing anybody else right now.  Bipolar disorder (manic depression) (HCC)   His symptoms are controlled.  He takes Depakote  250 mg twice a day.  Well adult exam   I will do routine labs and PSA screening for prostate.  His immunizations are up-to-date.  He will need colonoscopy next year.  He will continue to watch his     Return in about 3 months (around 05/13/2024).   Roetta Dare, MD      [1]  Allergies Allergen Reactions   Niacin Itching   "

## 2024-02-26 LAB — CMP14 + ANION GAP
ALT: 14 IU/L (ref 0–44)
AST: 16 IU/L (ref 0–40)
Albumin: 4.6 g/dL (ref 3.9–4.9)
Alkaline Phosphatase: 45 IU/L — ABNORMAL LOW (ref 47–123)
Anion Gap: 16 mmol/L (ref 10.0–18.0)
BUN/Creatinine Ratio: 21 (ref 10–24)
BUN: 29 mg/dL — ABNORMAL HIGH (ref 8–27)
Bilirubin Total: 0.3 mg/dL (ref 0.0–1.2)
CO2: 23 mmol/L (ref 20–29)
Calcium: 9.1 mg/dL (ref 8.6–10.2)
Chloride: 104 mmol/L (ref 96–106)
Creatinine, Ser: 1.37 mg/dL — ABNORMAL HIGH (ref 0.76–1.27)
Globulin, Total: 2 g/dL (ref 1.5–4.5)
Glucose: 214 mg/dL — ABNORMAL HIGH (ref 70–99)
Potassium: 3.8 mmol/L (ref 3.5–5.2)
Sodium: 143 mmol/L (ref 134–144)
Total Protein: 6.6 g/dL (ref 6.0–8.5)
eGFR: 56 mL/min/1.73 — ABNORMAL LOW

## 2024-02-26 LAB — MICROALBUMIN / CREATININE URINE RATIO
Creatinine, Urine: 55.7 mg/dL
Microalb/Creat Ratio: 7 mg/g{creat} (ref 0–29)
Microalbumin, Urine: 3.7 ug/mL

## 2024-02-26 LAB — LIPID PANEL
Chol/HDL Ratio: 3.2 ratio (ref 0.0–5.0)
Cholesterol, Total: 153 mg/dL (ref 100–199)
HDL: 48 mg/dL
LDL Chol Calc (NIH): 69 mg/dL (ref 0–99)
Triglycerides: 220 mg/dL — ABNORMAL HIGH (ref 0–149)
VLDL Cholesterol Cal: 36 mg/dL (ref 5–40)

## 2024-02-26 LAB — PSA: Prostate Specific Ag, Serum: 5.3 ng/mL — ABNORMAL HIGH (ref 0.0–4.0)

## 2024-03-01 NOTE — Assessment & Plan Note (Signed)
"    He says that his shortness of breath is better and he is not seeing anybody else right now. "

## 2024-03-01 NOTE — Assessment & Plan Note (Signed)
"    I will do routine labs and PSA screening for prostate.  His immunizations are up-to-date.  He will need colonoscopy next year.  He will continue to watch his  "

## 2024-03-01 NOTE — Assessment & Plan Note (Signed)
"    His symptoms are controlled.  He takes Depakote  250 mg twice a day. "

## 2024-03-03 ENCOUNTER — Ambulatory Visit: Payer: Self-pay

## 2024-03-05 NOTE — Progress Notes (Signed)
 Patient called.  Patient aware. His labs are okay

## 2024-03-07 ENCOUNTER — Ambulatory Visit: Admitting: Pharmacist

## 2024-03-10 ENCOUNTER — Other Ambulatory Visit: Payer: Self-pay | Admitting: Internal Medicine

## 2024-03-14 ENCOUNTER — Other Ambulatory Visit: Payer: Self-pay | Admitting: Internal Medicine

## 2024-03-21 ENCOUNTER — Ambulatory Visit (HOSPITAL_COMMUNITY)

## 2024-03-21 ENCOUNTER — Encounter

## 2024-04-21 ENCOUNTER — Ambulatory Visit: Admitting: Internal Medicine

## 2024-05-09 ENCOUNTER — Ambulatory Visit: Admitting: Internal Medicine

## 2024-08-08 ENCOUNTER — Ambulatory Visit: Admitting: Vascular Surgery

## 2024-08-08 ENCOUNTER — Ambulatory Visit (HOSPITAL_COMMUNITY)
